# Patient Record
Sex: Male | Born: 1970
Health system: Southern US, Community
[De-identification: ages and names within clinical notes are randomized; demographics above are authoritative.]

---

## 2016-10-31 ENCOUNTER — Emergency Department (HOSPITAL_COMMUNITY): Payer: No Typology Code available for payment source

## 2016-10-31 ENCOUNTER — Encounter (HOSPITAL_COMMUNITY): Payer: Self-pay | Admitting: Emergency Medicine

## 2016-10-31 ENCOUNTER — Emergency Department (HOSPITAL_COMMUNITY)
Admission: EM | Admit: 2016-10-31 | Discharge: 2016-11-01 | Disposition: A | Discharge status: Home / Self Care | Payer: No Typology Code available for payment source | Attending: Emergency Medicine | Admitting: Emergency Medicine

## 2016-10-31 DIAGNOSIS — Y939 Activity, unspecified: Secondary | ICD-10-CM | POA: Insufficient documentation

## 2016-10-31 DIAGNOSIS — S2242XA Multiple fractures of ribs, left side, initial encounter for closed fracture: Secondary | ICD-10-CM

## 2016-10-31 DIAGNOSIS — Y92410 Unspecified street and highway as the place of occurrence of the external cause: Secondary | ICD-10-CM | POA: Diagnosis not present

## 2016-10-31 DIAGNOSIS — F10920 Alcohol use, unspecified with intoxication, uncomplicated: Secondary | ICD-10-CM | POA: Diagnosis not present

## 2016-10-31 DIAGNOSIS — Y999 Unspecified external cause status: Secondary | ICD-10-CM | POA: Diagnosis not present

## 2016-10-31 DIAGNOSIS — R109 Unspecified abdominal pain: Secondary | ICD-10-CM | POA: Diagnosis not present

## 2016-10-31 LAB — I-STAT CG4 LACTIC ACID, ED: Lactic Acid, Venous: 2.03 mmol/L (ref 0.5–1.9)

## 2016-10-31 LAB — I-STAT CHEM 8, ED
BUN: 14 mg/dL (ref 6–20)
CALCIUM ION: 1.04 mmol/L — AB (ref 1.15–1.40)
CREATININE: 1.3 mg/dL — AB (ref 0.61–1.24)
Chloride: 106 mmol/L (ref 101–111)
Glucose, Bld: 102 mg/dL — ABNORMAL HIGH (ref 65–99)
HCT: 43 % (ref 39.0–52.0)
Hemoglobin: 14.6 g/dL (ref 13.0–17.0)
Potassium: 3.3 mmol/L — ABNORMAL LOW (ref 3.5–5.1)
Sodium: 142 mmol/L (ref 135–145)
TCO2: 24 mmol/L (ref 22–32)

## 2016-10-31 LAB — URINALYSIS, ROUTINE W REFLEX MICROSCOPIC
Bilirubin Urine: NEGATIVE
GLUCOSE, UA: NEGATIVE mg/dL
HGB URINE DIPSTICK: NEGATIVE
Ketones, ur: NEGATIVE mg/dL
LEUKOCYTES UA: NEGATIVE
Nitrite: NEGATIVE
PH: 6 (ref 5.0–8.0)
PROTEIN: NEGATIVE mg/dL
Specific Gravity, Urine: 1.018 (ref 1.005–1.030)

## 2016-10-31 LAB — COMPREHENSIVE METABOLIC PANEL
ALT: 26 U/L (ref 17–63)
ANION GAP: 10 (ref 5–15)
AST: 26 U/L (ref 15–41)
Albumin: 4 g/dL (ref 3.5–5.0)
Alkaline Phosphatase: 62 U/L (ref 38–126)
BILIRUBIN TOTAL: 0.8 mg/dL (ref 0.3–1.2)
BUN: 11 mg/dL (ref 6–20)
CHLORIDE: 105 mmol/L (ref 101–111)
CO2: 21 mmol/L — AB (ref 22–32)
CREATININE: 1.07 mg/dL (ref 0.61–1.24)
Calcium: 8.6 mg/dL — ABNORMAL LOW (ref 8.9–10.3)
GFR, EST AFRICAN AMERICAN: 47 mL/min — AB (ref >60)
GFR, EST NON AFRICAN AMERICAN: 41 mL/min — AB (ref >60)
GLUCOSE: 102 mg/dL — AB (ref 65–99)
Potassium: 3.1 mmol/L — ABNORMAL LOW (ref 3.5–5.1)
Sodium: 136 mmol/L (ref 135–145)
Total Protein: 7.2 g/dL (ref 6.5–8.1)

## 2016-10-31 LAB — PROTIME-INR
INR: 0.9
PROTHROMBIN TIME: 12.1 s (ref 11.4–15.2)

## 2016-10-31 LAB — CBC
HCT: 41.1 % (ref 39.0–52.0)
HEMOGLOBIN: 14.3 g/dL (ref 13.0–17.0)
MCH: 30.7 pg (ref 26.0–34.0)
MCHC: 34.8 g/dL (ref 30.0–36.0)
MCV: 88.2 fL (ref 78.0–100.0)
PLATELETS: 256 10*3/uL (ref 150–400)
RBC: 4.66 MIL/uL (ref 4.22–5.81)
RDW: 13.5 % (ref 11.5–15.5)
WBC: 9.5 10*3/uL (ref 4.0–10.5)

## 2016-10-31 LAB — ETHANOL: ALCOHOL ETHYL (B): 240 mg/dL — AB (ref <5)

## 2016-10-31 MED ORDER — IOPAMIDOL (ISOVUE-300) INJECTION 61%
INTRAVENOUS | Status: AC
Start: 2016-10-31 — End: 2016-10-31
  Administered 2016-10-31: 100 mL
  Filled 2016-10-31: qty 100

## 2016-10-31 MED ORDER — SODIUM CHLORIDE 0.9 % IV BOLUS (SEPSIS)
1000.0000 mL | Freq: Once | INTRAVENOUS | Status: AC
  Administered 2016-10-31: 1000 mL via INTRAVENOUS
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
## 2016-10-31 NOTE — ED Notes (Signed)
Pt to xray with RN, EMT, ED MD, and full monitor
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
## 2016-10-31 NOTE — ED Notes (Signed)
Pt to CT with RN, EMT, ED MD and full monitor
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
## 2016-11-01 MED ORDER — IBUPROFEN 600 MG PO TABS: 600.0000 mg | ORAL_TABLET | Freq: Three times a day (TID) | ORAL | 0 refills | Status: DC | PRN

## 2016-11-01 MED ORDER — HYDROCODONE-ACETAMINOPHEN 5-325 MG PO TABS: 1.0000 | ORAL_TABLET | Freq: Four times a day (QID) | ORAL | 0 refills | Status: DC | PRN

## 2016-11-01 NOTE — ED Notes (Signed)
Pt did well ambulating in hall.
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
## 2016-11-01 NOTE — ED Notes (Signed)
Pt on phone with wife 
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
## 2019-04-15 DIAGNOSIS — A419 Sepsis, unspecified organism: Secondary | ICD-10-CM

## 2019-04-15 DIAGNOSIS — I10 Essential (primary) hypertension: Secondary | ICD-10-CM

## 2019-04-15 DIAGNOSIS — B342 Coronavirus infection, unspecified: Secondary | ICD-10-CM

## 2019-04-15 DIAGNOSIS — J189 Pneumonia, unspecified organism: Secondary | ICD-10-CM

## 2019-04-15 DIAGNOSIS — R7303 Prediabetes: Secondary | ICD-10-CM

## 2019-04-15 HISTORY — DX: Sepsis, unspecified organism: A41.9

## 2019-04-15 HISTORY — DX: Essential (primary) hypertension: I10

## 2019-04-15 HISTORY — DX: Prediabetes: R73.03

## 2019-04-15 HISTORY — DX: Hypocalcemia: E83.51

## 2019-04-15 HISTORY — DX: Pneumonia, unspecified organism: J18.9

## 2019-04-15 HISTORY — DX: Coronavirus infection, unspecified: B34.2

## 2019-05-05 ENCOUNTER — Inpatient Hospital Stay (HOSPITAL_COMMUNITY)
Admission: EM | Admit: 2019-05-05 | Discharge: 2019-05-08 | DRG: 177 | Disposition: A | Discharge status: Home / Self Care | Payer: HRSA Program | Attending: Internal Medicine | Admitting: Internal Medicine

## 2019-05-05 ENCOUNTER — Encounter (HOSPITAL_COMMUNITY): Payer: Self-pay | Admitting: Emergency Medicine

## 2019-05-05 ENCOUNTER — Emergency Department (HOSPITAL_COMMUNITY): Payer: HRSA Program

## 2019-05-05 ENCOUNTER — Other Ambulatory Visit: Payer: Self-pay

## 2019-05-05 DIAGNOSIS — J9601 Acute respiratory failure with hypoxia: Secondary | ICD-10-CM | POA: Diagnosis present

## 2019-05-05 DIAGNOSIS — A419 Sepsis, unspecified organism: Secondary | ICD-10-CM | POA: Diagnosis present

## 2019-05-05 DIAGNOSIS — U071 COVID-19: Secondary | ICD-10-CM | POA: Diagnosis present

## 2019-05-05 DIAGNOSIS — E876 Hypokalemia: Secondary | ICD-10-CM | POA: Diagnosis present

## 2019-05-05 DIAGNOSIS — R739 Hyperglycemia, unspecified: Secondary | ICD-10-CM | POA: Diagnosis present

## 2019-05-05 DIAGNOSIS — J189 Pneumonia, unspecified organism: Secondary | ICD-10-CM

## 2019-05-05 DIAGNOSIS — J1282 Pneumonia due to coronavirus disease 2019: Secondary | ICD-10-CM | POA: Diagnosis present

## 2019-05-05 DIAGNOSIS — T380X5A Adverse effect of glucocorticoids and synthetic analogues, initial encounter: Secondary | ICD-10-CM | POA: Diagnosis present

## 2019-05-05 DIAGNOSIS — R0602 Shortness of breath: Secondary | ICD-10-CM | POA: Diagnosis not present

## 2019-05-05 DIAGNOSIS — R7303 Prediabetes: Secondary | ICD-10-CM | POA: Diagnosis present

## 2019-05-05 DIAGNOSIS — A4189 Other specified sepsis: Principal | ICD-10-CM | POA: Diagnosis present

## 2019-05-05 LAB — CBC WITH DIFFERENTIAL/PLATELET
Abs Immature Granulocytes: 0.2 10*3/uL — ABNORMAL HIGH (ref 0.00–0.07)
Abs Immature Granulocytes: 0.25 10*3/uL — ABNORMAL HIGH (ref 0.00–0.07)
Basophils Absolute: 0.1 10*3/uL (ref 0.0–0.1)
Basophils Absolute: 0.1 10*3/uL (ref 0.0–0.1)
Basophils Relative: 1 %
Basophils Relative: 1 %
Eosinophils Absolute: 0.5 10*3/uL (ref 0.0–0.5)
Eosinophils Absolute: 0.5 10*3/uL (ref 0.0–0.5)
Eosinophils Relative: 5 %
Eosinophils Relative: 5 %
HCT: 38.8 % — ABNORMAL LOW (ref 39.0–52.0)
HCT: 36.6 % — ABNORMAL LOW (ref 39.0–52.0)
Hemoglobin: 13.4 g/dL (ref 13.0–17.0)
Hemoglobin: 13.1 g/dL (ref 13.0–17.0)
Immature Granulocytes: 2 %
Immature Granulocytes: 2 %
Lymphocytes Relative: 12 %
Lymphocytes Relative: 13 %
Lymphs Abs: 1.3 10*3/uL (ref 0.7–4.0)
Lymphs Abs: 1.5 10*3/uL (ref 0.7–4.0)
MCH: 30.2 pg (ref 26.0–34.0)
MCH: 32 pg (ref 26.0–34.0)
MCHC: 34.5 g/dL (ref 30.0–36.0)
MCHC: 35.8 g/dL (ref 30.0–36.0)
MCV: 87.6 fL (ref 80.0–100.0)
MCV: 89.3 fL (ref 80.0–100.0)
Monocytes Absolute: 0.7 10*3/uL (ref 0.1–1.0)
Monocytes Absolute: 0.7 10*3/uL (ref 0.1–1.0)
Monocytes Relative: 7 %
Monocytes Relative: 6 %
Neutro Abs: 8 10*3/uL — ABNORMAL HIGH (ref 1.7–7.7)
Neutro Abs: 8.2 10*3/uL — ABNORMAL HIGH (ref 1.7–7.7)
Neutrophils Relative %: 73 %
Neutrophils Relative %: 73 %
Platelets: 487 10*3/uL — ABNORMAL HIGH (ref 150–400)
Platelets: 491 10*3/uL — ABNORMAL HIGH (ref 150–400)
RBC: 4.43 MIL/uL (ref 4.22–5.81)
RBC: 4.1 MIL/uL — ABNORMAL LOW (ref 4.22–5.81)
RDW: 13.1 % (ref 11.5–15.5)
RDW: 13.2 % (ref 11.5–15.5)
WBC: 10.8 10*3/uL — ABNORMAL HIGH (ref 4.0–10.5)
WBC: 11.1 10*3/uL — ABNORMAL HIGH (ref 4.0–10.5)
nRBC: 0 % (ref 0.0–0.2)
nRBC: 0 % (ref 0.0–0.2)

## 2019-05-05 LAB — COMPREHENSIVE METABOLIC PANEL
ALT: 24 U/L (ref 0–44)
ALT: 23 U/L (ref 0–44)
AST: 21 U/L (ref 15–41)
AST: 23 U/L (ref 15–41)
Albumin: 2.6 g/dL — ABNORMAL LOW (ref 3.5–5.0)
Albumin: 2.7 g/dL — ABNORMAL LOW (ref 3.5–5.0)
Alkaline Phosphatase: 74 U/L (ref 38–126)
Alkaline Phosphatase: 74 U/L (ref 38–126)
Anion gap: 15 (ref 5–15)
Anion gap: 14 (ref 5–15)
BUN: 13 mg/dL (ref 6–20)
BUN: 13 mg/dL (ref 6–20)
CO2: 18 mmol/L — ABNORMAL LOW (ref 22–32)
CO2: 20 mmol/L — ABNORMAL LOW (ref 22–32)
Calcium: 8.3 mg/dL — ABNORMAL LOW (ref 8.9–10.3)
Calcium: 8.3 mg/dL — ABNORMAL LOW (ref 8.9–10.3)
Chloride: 100 mmol/L (ref 98–111)
Chloride: 99 mmol/L (ref 98–111)
Creatinine, Ser: 0.99 mg/dL (ref 0.61–1.24)
Creatinine, Ser: 0.96 mg/dL (ref 0.61–1.24)
GFR calc Af Amer: >60 mL/min (ref >60)
GFR calc Af Amer: >60 mL/min (ref >60)
GFR calc non Af Amer: >60 mL/min (ref >60)
GFR calc non Af Amer: >60 mL/min (ref >60)
Glucose, Bld: 135 mg/dL — ABNORMAL HIGH (ref 70–99)
Glucose, Bld: 106 mg/dL — ABNORMAL HIGH (ref 70–99)
Potassium: 3.4 mmol/L — ABNORMAL LOW (ref 3.5–5.1)
Potassium: 3.5 mmol/L (ref 3.5–5.1)
Sodium: 133 mmol/L — ABNORMAL LOW (ref 135–145)
Sodium: 133 mmol/L — ABNORMAL LOW (ref 135–145)
Total Bilirubin: 1 mg/dL (ref 0.3–1.2)
Total Bilirubin: 1.2 mg/dL (ref 0.3–1.2)
Total Protein: 6.7 g/dL (ref 6.5–8.1)
Total Protein: 6.7 g/dL (ref 6.5–8.1)

## 2019-05-05 LAB — URINALYSIS, ROUTINE W REFLEX MICROSCOPIC
Bilirubin Urine: NEGATIVE
Glucose, UA: NEGATIVE mg/dL
Hgb urine dipstick: NEGATIVE
Ketones, ur: 20 mg/dL — AB
Leukocytes,Ua: NEGATIVE
Nitrite: NEGATIVE
Protein, ur: NEGATIVE mg/dL
Specific Gravity, Urine: 1.013 (ref 1.005–1.030)
pH: 6 (ref 5.0–8.0)

## 2019-05-05 LAB — CK: Total CK: 170 U/L (ref 49–397)

## 2019-05-05 LAB — LACTIC ACID, PLASMA: Lactic Acid, Venous: 1.2 mmol/L (ref 0.5–1.9)

## 2019-05-05 LAB — PROTIME-INR
INR: 1.1 (ref 0.8–1.2)
Prothrombin Time: 14.1 seconds (ref 11.4–15.2)

## 2019-05-05 LAB — TROPONIN I (HIGH SENSITIVITY): Troponin I (High Sensitivity): 4 ng/L (ref <18)

## 2019-05-05 MED ORDER — ACETAMINOPHEN 325 MG PO TABS
650.0000 mg | ORAL_TABLET | Freq: Once | ORAL | Status: AC
  Administered 2019-05-05: 650 mg via ORAL
  Filled 2019-05-05: qty 2

## 2019-05-05 MED ORDER — SODIUM CHLORIDE 0.9 % IV SOLN
500.0000 mg | Freq: Once | INTRAVENOUS | Status: AC
  Administered 2019-05-05: 500 mg via INTRAVENOUS
  Filled 2019-05-05: qty 500

## 2019-05-05 MED ORDER — SODIUM CHLORIDE 0.9 % IV SOLN
1.0000 g | Freq: Once | INTRAVENOUS | Status: AC
  Administered 2019-05-05: 1 g via INTRAVENOUS
  Filled 2019-05-05: qty 10

## 2019-05-05 NOTE — ED Triage Notes (Signed)
Patient reports persistent productive cough with chest congestion , fever , chills and mild dizziness onset last week .

## 2019-05-05 NOTE — H&P (Signed)
[employer] Kees RFX:[id number] DOB: 04/12/1970 DOA: 05/05/2019     PCP: Patient, No Pcp Per   Outpatient Specialists:   NONE   Patient arrived to ER on 05/05/19 at 1902  Patient coming from: home With family   Chief Complaint:   Chief Complaint  Patient presents with  . Cough/Fever    HPI: Fernando Lozano is a 49 y.o. male with medical history significant of history of alcohol abuse    Presented with 2 week history of persistent cough fevers chills myalgias dizziness   Reports chest pain with coughing.   Infectious risk factors:  Reports fever, shortness of breath, dry cough, chest pain, Sore throat, URI symptoms, anosmia/change in taste,    Has  NOt been vaccinated against COVID    in house  PCR testing  Pending  No results found for: SARSCOV2NAA   Regarding pertinent Chronic problems:    While in ER: Chest x-ray showed mild diffuse bilateral infiltrates Hypoxic down to 88% when ambulating   Hospitalist was called for admission for acute respiratory failure   The following Work up has been ordered so far:  Orders Placed This Encounter  Procedures  . Culture, blood (single)  . SARS CORONAVIRUS 2 (TAT 6-24 HRS) Nasopharyngeal Nasopharyngeal Swab  . Urine culture  . DG Chest Portable 1 View  . CBC with Differential  . Comprehensive metabolic panel  . Lactic acid, plasma  . Comprehensive metabolic panel  . CBC with Differential  . Protime-INR  . CK  . Urinalysis, Routine w reflex microscopic  . Consult for [place] Admission  ALL PATIENTS BEING ADMITTED/HAVING PROCEDURES NEED COVID-19 SCREENING  . Airborne and Contact precautions  . POC SARS Coronavirus 2 Ag-ED - Nasal Swab (BD Veritor Kit)  . EKG 12-Lead    Following Medications were ordered in ER: Medications  azithromycin (ZITHROMAX) 500 mg in sodium chloride 0.9 % 250 mL IVPB (500 mg Intravenous New Bag/Given 05/05/19 2248)  acetaminophen (TYLENOL) tablet 650 mg (650 mg  Oral Given 05/05/19 1924)  cefTRIAXone (ROCEPHIN) 1 g in sodium chloride 0.9 % 100 mL IVPB (0 g Intravenous Stopped 05/05/19 2319)      Significant initial  Findings: Abnormal Labs Reviewed  CBC WITH DIFFERENTIAL/PLATELET - Abnormal; Notable for the following components:      Result Value   WBC 10.8 (*)    HCT 38.8 (*)    Platelets 487 (*)    Neutro Abs 8.0 (*)    Abs Immature Granulocytes 0.20 (*)    All other components within normal limits  COMPREHENSIVE METABOLIC PANEL - Abnormal; Notable for the following components:   Sodium 133 (*)    Potassium 3.4 (*)    CO2 18 (*)    Glucose, Bld 135 (*)    Calcium 8.3 (*)    Albumin 2.6 (*)    All other components within normal limits  COMPREHENSIVE METABOLIC PANEL - Abnormal; Notable for the following components:   Sodium 133 (*)    CO2 20 (*)    Glucose, Bld 106 (*)    Calcium 8.3 (*)    Albumin 2.7 (*)    All other components within normal limits  CBC WITH DIFFERENTIAL/PLATELET - Abnormal; Notable for the following components:   WBC 11.1 (*)    RBC 4.10 (*)    HCT 36.6 (*)    Platelets 491 (*)    Neutro Abs 8.2 (*)    Abs Immature Granulocytes 0.25 (*)    All other  components within normal limits  URINALYSIS, ROUTINE W REFLEX MICROSCOPIC - Abnormal; Notable for the following components:   Ketones, ur 20 (*)    All other components within normal limits    Otherwise labs showing:    Recent Labs  Lab 05/05/19 1927 05/05/19 2156  NA 133* 133*  K 3.4* 3.5  CO2 18* 20*  GLUCOSE 135* 106*  BUN 13 13  CREATININE 0.99 0.96  CALCIUM 8.3* 8.3*    Cr  stable,    Lab Results  Component Value Date   CREATININE 0.96 05/05/2019   CREATININE 0.99 05/05/2019   CREATININE 1.30 (H) 10/31/2016    Recent Labs  Lab 05/05/19 1927 05/05/19 2156  AST 21 23  ALT 24 23  ALKPHOS 74 74  BILITOT 1.0 1.2  PROT 6.7 6.7  ALBUMIN 2.6* 2.7*   Lab Results  Component Value Date   CALCIUM 8.3 (L) 05/05/2019       WBC         Component Value Date/Time   WBC 11.1 (H) 05/05/2019 2156   ANC    Component Value Date/Time   NEUTROABS 8.2 (H) 05/05/2019 2156   ALC No components found for: LYMPHAB    Plt: Lab Results  Component Value Date   PLT 491 (H) 05/05/2019     Lactic Acid, Venous    Component Value Date/Time   LATICACIDVEN 1.2 05/05/2019 2156    Procalcitonin  Ordered   COVID-19 Labs   No results found for: SARSCOV2NAA      HG/HCT stable,       Component Value Date/Time   HGB 13.1 05/05/2019 2156   HCT 36.6 (L) 05/05/2019 2156      Troponin 4 Cardiac Panel (last 3 results) Recent Labs    05/05/19 2224  CKTOTAL 170       ECG: Ordered Personally reviewed by me showing: HR : 97 Rhythm:  Sinus     no evidence of ischemic changes QTC 447    UA  no evidence of UTI      Urine analysis:    Component Value Date/Time   COLORURINE YELLOW 05/05/2019 2253   APPEARANCEUR CLEAR 05/05/2019 2253   LABSPEC 1.013 05/05/2019 2253   PHURINE 6.0 05/05/2019 2253   GLUCOSEU NEGATIVE 05/05/2019 2253   HGBUR NEGATIVE 05/05/2019 2253   BILIRUBINUR NEGATIVE 05/05/2019 2253   KETONESUR 20 (A) 05/05/2019 2253   PROTEINUR NEGATIVE 05/05/2019 2253   NITRITE NEGATIVE 05/05/2019 2253   LEUKOCYTESUR NEGATIVE 05/05/2019 2253         CXR - bilateral infiltrates    CTA chest -  Ordered       ED Triage Vitals  Enc Vitals Group     BP 05/05/19 1912 114/77     Pulse Rate 05/05/19 1912 90     Resp 05/05/19 1912 (!) 22     Temp 05/05/19 1912 (!) 101.9 F (38.8 C)     Temp Source 05/05/19 1912 Oral     SpO2 05/05/19 1912 98 %     Weight 05/05/19 1912 160 lb (72.6 kg)     Height 05/05/19 1912 '5\' 2"'$  (1.575 m)     Head Circumference --      Peak Flow --      Pain Score 05/05/19 1918 0     Pain Loc --      Pain Edu? --      Excl. in [facility]? --   TMAX(24)@       Latest  Blood  pressure 114/74, pulse 71, temperature (!) 100.5 F (38.1 C), temperature source Oral, resp. rate 20, height '5\' 2"'$   (1.575 m), weight 72.6 kg, SpO2 94 %.       Review of Systems:    Pertinent positives include:  Fevers, chills, fatigue, chest pain  non-productive cough, dyspnea on exertion shortness of breath at rest.  Constitutional:  No weight loss, night sweats,  weight loss  HEENT:  No headaches, Difficulty swallowing,Tooth/dental problems,Sore throat,  No sneezing, itching, ear ache, nasal congestion, post nasal drip,  Cardio-vascular:  No , Orthopnea, PND, anasarca, dizziness, palpitations.no Bilateral lower extremity swelling  GI:  No heartburn, indigestion, abdominal pain, nausea, vomiting, diarrhea, change in bowel habits, loss of appetite, melena, blood in stool, hematemesis Resp:   No excess mucus, no productive cough,  No coughing up of blood.No change in color of mucus.No wheezing. Skin:  no rash or lesions. No jaundice GU:  no dysuria, change in color of urine, no urgency or frequency. No straining to urinate.  No flank pain.  Musculoskeletal:  No joint pain or no joint swelling. No decreased range of motion. No back pain.  Psych:  No change in mood or affect. No depression or anxiety. No memory loss.  Neuro: no localizing neurological complaints, no tingling, no weakness, no double vision, no gait abnormality, no slurred speech, no confusion  All systems reviewed and apart from [place] all are negative  Past Medical History:  History reviewed. No pertinent past medical history.    History reviewed. No pertinent surgical history.  Social History:  Ambulatory  independently       reports that he has never smoked. He has never used smokeless tobacco. He reports current alcohol use. He reports that he does not use drugs.   Family History:   Family History  Problem Relation Age of Onset  . Hypertension Neg Hx   . Diabetes Neg Hx     Allergies: No Known Allergies   Prior to Admission medications   Medication Sig Start Date End Date Taking? Authorizing Provider    HYDROcodone-acetaminophen (NORCO/VICODIN) 5-325 MG tablet Take 1 tablet by mouth every 6 (six) hours as needed for severe pain. 11/01/16   Ripley Fraise, MD  ibuprofen (ADVIL,MOTRIN) 600 MG tablet Take 1 tablet (600 mg total) by mouth every 8 (eight) hours as needed for moderate pain. 11/01/16   Ripley Fraise, MD   Physical Exam: Blood pressure 114/74, pulse 71, temperature (!) 100.5 F (38.1 C), temperature source Oral, resp. rate 20, height '5\' 2"'$  (1.575 m), weight 72.6 kg, SpO2 94 %. 1. General:  in No  Acute distress   Chronically ill  -appearing 2. Psychological: Alert and   Oriented 3. Head/ENT:    Dry Mucous Membranes                          Head Non traumatic, neck supple                       Poor Dentition 4. SKIN:  decreased Skin turgor,  Skin clean Dry and intact no rash 5. Heart: Regular rate and rhythm no  Murmur, no Rub or gallop 6. Lungs:   no wheezes or crackles   7. Abdomen: Soft non-tender, Non distended   bowel sounds present 8. Lower extremities: no clubbing, cyanosis, no edema 9. Neurologically Grossly intact, moving all 4 extremities equally   10. MSK: Normal range of motion  All other LABS:     Recent Labs  Lab 05/05/19 1927 05/05/19 2156  WBC 10.8* 11.1*  NEUTROABS 8.0* 8.2*  HGB 13.4 13.1  HCT 38.8* 36.6*  MCV 87.6 89.3  PLT 487* 491*     Recent Labs  Lab 05/05/19 1927 05/05/19 2156  NA 133* 133*  K 3.4* 3.5  CL 100 99  CO2 18* 20*  GLUCOSE 135* 106*  BUN 13 13  CREATININE 0.99 0.96  CALCIUM 8.3* 8.3*     Recent Labs  Lab 05/05/19 1927 05/05/19 2156  AST 21 23  ALT 24 23  ALKPHOS 74 74  BILITOT 1.0 1.2  PROT 6.7 6.7  ALBUMIN 2.6* 2.7*       Cultures: No results found for: SDES, SPECREQUEST, CULT, REPTSTATUS   Radiological Exams on Admission: DG Chest Portable 1 View  Result Date: 05/05/2019 CLINICAL DATA:  Fever, cough and congestion. EXAM: PORTABLE CHEST 1 VIEW COMPARISON:  October 31, 2016 FINDINGS: Mild diffuse  bilateral infiltrates are seen involving predominantly the lateral aspects of both lungs. There is no evidence of a pleural effusion or pneumothorax. The heart size and mediastinal contours are within normal limits. The visualized skeletal structures are unremarkable. IMPRESSION: Mild diffuse bilateral infiltrates. Electronically Signed   By: Virgina Norfolk M.D.   On: 05/05/2019 21:14    Chart has been reviewed    Assessment/Plan  49 y.o. male with medical history significant of history of alcohol abuse     Admitted for CAP strong suspicion for Covid  Present on Admission: . Acute respiratory failure with hypoxia (HCC) -secondary to pneumonia of note patient prior had CT scans in 2018 that showed bilateral interstitial opacities cannot rule out interstitial lung disease will obtain CT to further evaluate   . CAP (community acquired pneumonia)-for now cover for bacterial pneumonia initial rapid Covid test negative but will await results of PCR very high pretest probability for Covid given typical symptoms.  We will continue as a PUI.   . Sepsis ([place]) -persistent febrile illness will treat with IV antibiotics and continue to monitor most likely cause of sepsis being community-acquired pneumonia versus Covid.  Prior history of alcohol abuse currently in remission  Other plan as per orders.  DVT prophylaxis:  Lovenox     Code Status:  FULL CODE  as per patient   I had personally discussed CODE STATUS with patient     Family Communication:   Family not at  Bedside    Disposition Plan:        To home once workup is complete and patient is stable   Following barriers for discharge:                                                 Will likely need home health, home O2, set up if still hypoxic                                                            Consults called: none    Admission status:  ED Disposition    ED Disposition Condition [facility] Area: [place] [  [id number]]  Level of Care: Telemetry Medical [104]  I expect the patient will be discharged within 24 hours: No (not a candidate for 5C-Observation unit)  Covid Evaluation: Symptomatic Person Under Investigation (PUI)  Diagnosis: CAP (community acquired pneumonia) [160109]  Admitting Physician: Toy Baker [[id number]]  Attending Physician: Toy Baker [[id number]]       Obs      Level of care    tele  For 12H     Precautions: admitted as PUI  Airborne and Contact precautions  If Covid PCR is negative  -  would need additional investigation given very high risk for false native test result   PPE: Used by the provider:   P100  eye Goggles,  Gloves  gown       Ranvir Renovato 05/05/2019, 12:06 AM    [facility]     after 2 AM please page floor coverage PA If 7AM-7PM, please contact the day team taking care of the patient using [email or web address]   Patient was evaluated in the context of the global COVID-19 pandemic, which necessitated consideration that the patient might be at risk for infection with the SARS-CoV-2 virus that causes COVID-19. Institutional protocols and algorithms that pertain to the evaluation of patients at risk for COVID-19 are in a state of rapid change based on information released by regulatory bodies including the CDC and federal and state organizations. These policies and algorithms were followed during the patient's care.

## 2019-05-05 NOTE — ED Provider Notes (Signed)
[place] EMERGENCY DEPARTMENT Provider Note   CSN: [id number] Arrival date & time: 05/05/19  1902     History Chief Complaint  Patient presents with  . Cough/Fever    [place] is a 49 y.o. male.  HPI Patient is a 49 year old male with no significant past medical history presenting to the ED today due to shortness of breath.  Patient says that for the past week, he has experienced diarrhea, dry cough and fever.  His cough is nonproductive but when he begins to cough he experiences chest tightness.  He says that since Friday, he has experienced decreased appetite and has been vomiting.  He works at a [employer] site where many of his coworkers have had cough and congestion over the past few weeks.  He denies any known Covid contacts.  He denies change in taste or smell.    History reviewed. No pertinent past medical history.  There are no problems to display for this patient.   History reviewed. No pertinent surgical history.     No family history on file.  Social History   Tobacco Use  . Smoking status: Unknown If Ever Smoked  . Smokeless tobacco: Never Used  Substance Use Topics  . Alcohol use: Yes  . Drug use: Never    Home Medications Prior to Admission medications   Medication Sig Start Date End Date Taking? Authorizing Provider  HYDROcodone-acetaminophen (NORCO/VICODIN) 5-325 MG tablet Take 1 tablet by mouth every 6 (six) hours as needed for severe pain. 11/01/16   Ripley Fraise, MD  ibuprofen (ADVIL,MOTRIN) 600 MG tablet Take 1 tablet (600 mg total) by mouth every 8 (eight) hours as needed for moderate pain. 11/01/16   Ripley Fraise, MD    Allergies    Patient has no known allergies.  Review of Systems   Review of Systems  Constitutional: Positive for appetite change, chills, fatigue and fever.  HENT: Positive for sore throat. Negative for rhinorrhea.   Eyes: Negative for photophobia and visual disturbance.    Respiratory: Positive for cough, chest tightness and shortness of breath.   Cardiovascular: Negative for palpitations.  Gastrointestinal: Positive for diarrhea, nausea and vomiting. Negative for abdominal pain.  Genitourinary: Negative for dysuria and hematuria.  Musculoskeletal: Negative for arthralgias and back pain.  Skin: Negative for color change and rash.  Neurological: Negative for weakness and light-headedness.  Psychiatric/Behavioral: Negative for agitation.  All other systems reviewed and are negative.   Physical Exam Updated Vital Signs BP 114/86   Pulse 75   Temp (!) 100.5 F (38.1 C) (Oral)   Resp (!) 22   Ht 5\' 2"  (1.575 m)   Wt 72.6 kg   SpO2 96%   BMI 29.26 kg/m   Physical Exam Vitals and nursing note reviewed.  Constitutional:      General: He is not in acute distress.    Appearance: Normal appearance. He is well-developed. He is not ill-appearing.  HENT:     Head: Normocephalic and atraumatic.     Right Ear: External ear normal.     Left Ear: External ear normal.     Nose: Nose normal. No congestion or rhinorrhea.     Mouth/Throat:     Mouth: Mucous membranes are moist.     Pharynx: Oropharynx is clear.  Eyes:     Extraocular Movements: Extraocular movements intact.     Pupils: Pupils are equal, round, and reactive to light.  Cardiovascular:     Rate and Rhythm: Normal rate  and regular rhythm.     Pulses: Normal pulses.     Heart sounds: Normal heart sounds.  Pulmonary:     Effort: Pulmonary effort is normal. No respiratory distress.     Breath sounds: Normal breath sounds. No stridor. No wheezing, rhonchi or rales.     Comments: Mildly increased work of breathing. Abdominal:     General: There is no distension.     Palpations: Abdomen is soft.     Tenderness: There is no abdominal tenderness. There is no guarding or rebound.  Musculoskeletal:        General: Normal range of motion.     Cervical back: Normal range of motion and neck supple. No  rigidity.     Right lower leg: No edema.     Left lower leg: No edema.  Skin:    General: Skin is warm and dry.     Capillary Refill: Capillary refill takes less than 2 seconds.  Neurological:     General: No focal deficit present.     Mental Status: He is alert and oriented to person, place, and time. Mental status is at baseline.  Psychiatric:        Mood and Affect: Mood normal.     ED Results / Procedures / Treatments   Labs (all labs ordered are listed, but only abnormal results are displayed) Labs Reviewed  CBC WITH DIFFERENTIAL/PLATELET - Abnormal; Notable for the following components:      Result Value   WBC 10.8 (*)    HCT 38.8 (*)    Platelets 487 (*)    Neutro Abs 8.0 (*)    Abs Immature Granulocytes 0.20 (*)    All other components within normal limits  COMPREHENSIVE METABOLIC PANEL - Abnormal; Notable for the following components:   Sodium 133 (*)    Potassium 3.4 (*)    CO2 18 (*)    Glucose, Bld 135 (*)    Calcium 8.3 (*)    Albumin 2.6 (*)    All other components within normal limits  COMPREHENSIVE METABOLIC PANEL - Abnormal; Notable for the following components:   Sodium 133 (*)    CO2 20 (*)    Glucose, Bld 106 (*)    Calcium 8.3 (*)    Albumin 2.7 (*)    All other components within normal limits  CBC WITH DIFFERENTIAL/PLATELET - Abnormal; Notable for the following components:   WBC 11.1 (*)    RBC 4.10 (*)    HCT 36.6 (*)    Platelets 491 (*)    Neutro Abs 8.2 (*)    Abs Immature Granulocytes 0.25 (*)    All other components within normal limits  URINALYSIS, ROUTINE W REFLEX MICROSCOPIC - Abnormal; Notable for the following components:   Ketones, ur 20 (*)    All other components within normal limits  D-DIMER, QUANTITATIVE (NOT AT [facility]) - Abnormal; Notable for the following components:   D-Dimer, Quant 2.02 (*)    All other components within normal limits  FIBRINOGEN - Abnormal; Notable for the following components:   Fibrinogen >800 (*)     All other components within normal limits  LACTATE DEHYDROGENASE - Abnormal; Notable for the following components:   LDH 323 (*)    All other components within normal limits  CULTURE, BLOOD (SINGLE)  SARS CORONAVIRUS 2 (TAT 6-24 HRS)  URINE CULTURE  EXPECTORATED SPUTUM ASSESSMENT W REFEX TO RESP CULTURE  RESPIRATORY PANEL BY PCR  LACTIC ACID, PLASMA  PROTIME-INR  CK  HIV ANTIBODY (ROUTINE TESTING W REFLEX)  C-REACTIVE PROTEIN  FERRITIN  PROCALCITONIN  RAPID URINE DRUG SCREEN, HOSP PERFORMED  ETHANOL  MAGNESIUM  PHOSPHORUS  COMPREHENSIVE METABOLIC PANEL  STREP PNEUMONIAE URINARY ANTIGEN  PROCALCITONIN  CBC  TSH  POC SARS CORONAVIRUS 2 AG -  ED  TROPONIN I (HIGH SENSITIVITY)  TROPONIN I (HIGH SENSITIVITY)    EKG EKG Interpretation  Date/Time:  Sunday May 05 2019 19:11:10 EDT Ventricular Rate:  97 PR Interval:  158 QRS Duration: 94 QT Interval:  352 QTC Calculation: 447 R Axis:   53 Text Interpretation: Normal sinus rhythm T wave abnormality Artifact Baseline wander Abnormal ECG Confirmed by Carmin Muskrat [id number]) on 05/05/2019 10:23:48 PM   Radiology DG Chest Portable 1 View  Result Date: 05/05/2019 CLINICAL DATA:  Fever, cough and congestion. EXAM: PORTABLE CHEST 1 VIEW COMPARISON:  October 31, 2016 FINDINGS: Mild diffuse bilateral infiltrates are seen involving predominantly the lateral aspects of both lungs. There is no evidence of a pleural effusion or pneumothorax. The heart size and mediastinal contours are within normal limits. The visualized skeletal structures are unremarkable. IMPRESSION: Mild diffuse bilateral infiltrates. Electronically Signed   By: Virgina Norfolk M.D.   On: 05/05/2019 21:14    Procedures Procedures (including critical care time)  Medications Ordered in ED Medications  acetaminophen (TYLENOL) tablet 650 mg (has no administration in time range)    Or  acetaminophen (TYLENOL) suppository 650 mg (has no administration in time  range)  HYDROcodone-acetaminophen (NORCO/VICODIN) 5-325 MG per tablet 1-2 tablet (has no administration in time range)  ondansetron (ZOFRAN) tablet 4 mg (has no administration in time range)    Or  ondansetron (ZOFRAN) injection 4 mg (has no administration in time range)  cefTRIAXone (ROCEPHIN) 2 g in sodium chloride 0.9 % 100 mL IVPB (has no administration in time range)  azithromycin (ZITHROMAX) 500 mg in sodium chloride 0.9 % 250 mL IVPB (has no administration in time range)  enoxaparin (LOVENOX) injection 40 mg (40 mg Subcutaneous Given 05/06/19 0022)  sodium chloride flush (NS) 0.9 % injection 3 mL (3 mLs Intravenous Given 05/06/19 0007)  sodium chloride flush (NS) 0.9 % injection 3 mL (has no administration in time range)  0.9 %  sodium chloride infusion (has no administration in time range)  acetaminophen (TYLENOL) tablet 650 mg (650 mg Oral Given 05/05/19 1924)  cefTRIAXone (ROCEPHIN) 1 g in sodium chloride 0.9 % 100 mL IVPB (0 g Intravenous Stopped 05/05/19 2319)  azithromycin (ZITHROMAX) 500 mg in sodium chloride 0.9 % 250 mL IVPB (0 mg Intravenous Stopped 05/05/19 2342)    ED Course  I have reviewed the triage vital signs and the nursing notes.  Pertinent labs & imaging results that were available during my care of the patient were reviewed by me and considered in my medical decision making (see chart for details).    MDM Rules/Calculators/A&P                     Patient is a 49 year old male with no significant past medical history presenting to the ED today due to shortness of breath.  On exam, patient has mildly increased work of breathing.  BP 114/77, HR 90, RR 22, SPO2 98% on room air.  Temp 101.1F.  On arrival, patient appears to have increased work of breathing but has no signs of hypoxia.  He coughs frequently during my assessment and has to stop and catch his breath.  He says that he has had persistent fever  and cough for the past week with other associated viral type  symptoms.  He has had many sick contacts at work but no one has tested positive for Covid as far as he knows.  EKG shows normal sinus rhythm with T wave inversions in II, III and aVF that are not seen on previous studies.  Chest x-ray shows "mild diffuse bilateral infiltrates."  Initial Covid antigen test negative.  Collected 6-hour Covid test.  Given patient's fever and tachypnea, he meets SIRS criteria.  Blood cultures collected.  Initiated antibiotics for community-acquired pneumonia.  However, we have a strong suspicion that he does have Covid pneumonia.  We will hold on IV fluids initially to our high concern for Covid.   CBC with WBC 11.1, absolute neutrophils 8.2.  CMP with CO2 20, calcium 8.3, albumin 2.7.  Lactic acid 1.2.  Initial troponin is 4.  Urinalysis with no signs of infection.  Patient ambulated around his room and his SPO2 was noted to drop to 88%.  It was noted to come back up to the mid 90s with rest.  At this time, patient appears to have sepsis with a potential source of community-acquired pneumonia.  Patient may also be experiencing complications of [id number].  After discussion with inpatient team, will obtain CTA to get a better imaging of his lungs and evaluate for PE.  Patient admitted to hospitalist service.  For details on hospital course following admission, please refer to inpatient team's note.  Patient stable at time of admission.  Patient assessed and evaluated with Dr. Vanita Panda.  Nadeen Landau, MD     Final Clinical Impression(s) / ED Diagnoses Final diagnoses:  Sepsis with acute hypoxic respiratory failure, due to unspecified organism, unspecified whether septic shock present [facility])    Rx / DC Orders ED Discharge Orders    None       Nadeen Landau, MD 05/06/19 [id number]    Carmin Muskrat, MD 05/06/19 [id number]    Carmin Muskrat, MD 05/21/19 1630

## 2019-05-05 NOTE — ED Notes (Signed)
Per interpretur   Pt symptoms started around 2 weeks ago. He noticed he started getting a cough a few days ago and when he coughs he has chest pain. The fevers started around a week ago. Pt states he works but everyone wears a mask so he does not know if he has been exposed to COVID-19.

## 2019-05-05 NOTE — ED Notes (Signed)
Pt ambulated in room, SpO2 dropped to 88% on RA, and returned to 98% when resting.

## 2019-05-06 ENCOUNTER — Inpatient Hospital Stay (HOSPITAL_COMMUNITY): Payer: HRSA Program

## 2019-05-06 DIAGNOSIS — R0602 Shortness of breath: Secondary | ICD-10-CM | POA: Diagnosis present

## 2019-05-06 DIAGNOSIS — U071 COVID-19: Secondary | ICD-10-CM | POA: Diagnosis present

## 2019-05-06 DIAGNOSIS — J9601 Acute respiratory failure with hypoxia: Secondary | ICD-10-CM | POA: Diagnosis present

## 2019-05-06 DIAGNOSIS — E876 Hypokalemia: Secondary | ICD-10-CM

## 2019-05-06 DIAGNOSIS — A4189 Other specified sepsis: Secondary | ICD-10-CM | POA: Diagnosis present

## 2019-05-06 DIAGNOSIS — R739 Hyperglycemia, unspecified: Secondary | ICD-10-CM | POA: Diagnosis present

## 2019-05-06 DIAGNOSIS — T380X5A Adverse effect of glucocorticoids and synthetic analogues, initial encounter: Secondary | ICD-10-CM | POA: Diagnosis present

## 2019-05-06 DIAGNOSIS — R7303 Prediabetes: Secondary | ICD-10-CM | POA: Diagnosis present

## 2019-05-06 DIAGNOSIS — J1282 Pneumonia due to coronavirus disease 2019: Secondary | ICD-10-CM | POA: Diagnosis present

## 2019-05-06 LAB — LACTATE DEHYDROGENASE: LDH: 323 U/L — ABNORMAL HIGH (ref 98–192)

## 2019-05-06 LAB — CBC
HCT: 35.2 % — ABNORMAL LOW (ref 39.0–52.0)
Hemoglobin: 12.1 g/dL — ABNORMAL LOW (ref 13.0–17.0)
MCH: 30 pg (ref 26.0–34.0)
MCHC: 34.4 g/dL (ref 30.0–36.0)
MCV: 87.3 fL (ref 80.0–100.0)
Platelets: 500 10*3/uL — ABNORMAL HIGH (ref 150–400)
RBC: 4.03 MIL/uL — ABNORMAL LOW (ref 4.22–5.81)
RDW: 13.2 % (ref 11.5–15.5)
WBC: 10 10*3/uL (ref 4.0–10.5)
nRBC: 0 % (ref 0.0–0.2)

## 2019-05-06 LAB — URINE CULTURE: Culture: NO GROWTH

## 2019-05-06 LAB — COMPREHENSIVE METABOLIC PANEL
ALT: 20 U/L (ref 0–44)
AST: 21 U/L (ref 15–41)
Albumin: 2.4 g/dL — ABNORMAL LOW (ref 3.5–5.0)
Alkaline Phosphatase: 73 U/L (ref 38–126)
Anion gap: 14 (ref 5–15)
BUN: 13 mg/dL (ref 6–20)
CO2: 20 mmol/L — ABNORMAL LOW (ref 22–32)
Calcium: 8 mg/dL — ABNORMAL LOW (ref 8.9–10.3)
Chloride: 100 mmol/L (ref 98–111)
Creatinine, Ser: 0.84 mg/dL (ref 0.61–1.24)
GFR calc Af Amer: >60 mL/min (ref >60)
GFR calc non Af Amer: >60 mL/min (ref >60)
Glucose, Bld: 99 mg/dL (ref 70–99)
Potassium: 3.3 mmol/L — ABNORMAL LOW (ref 3.5–5.1)
Sodium: 134 mmol/L — ABNORMAL LOW (ref 135–145)
Total Bilirubin: 0.4 mg/dL (ref 0.3–1.2)
Total Protein: 6.4 g/dL — ABNORMAL LOW (ref 6.5–8.1)

## 2019-05-06 LAB — PHOSPHORUS: Phosphorus: 4.4 mg/dL (ref 2.5–4.6)

## 2019-05-06 LAB — RAPID URINE DRUG SCREEN, HOSP PERFORMED
Amphetamines: NOT DETECTED
Barbiturates: NOT DETECTED
Benzodiazepines: NOT DETECTED
Cocaine: NOT DETECTED
Opiates: NOT DETECTED
Tetrahydrocannabinol: NOT DETECTED

## 2019-05-06 LAB — TROPONIN I (HIGH SENSITIVITY): Troponin I (High Sensitivity): 6 ng/L (ref <18)

## 2019-05-06 LAB — PROCALCITONIN
Procalcitonin: 0.44 ng/mL
Procalcitonin: 0.4 ng/mL

## 2019-05-06 LAB — C-REACTIVE PROTEIN: CRP: 25.9 mg/dL — ABNORMAL HIGH (ref <1.0)

## 2019-05-06 LAB — STREP PNEUMONIAE URINARY ANTIGEN: Strep Pneumo Urinary Antigen: NEGATIVE

## 2019-05-06 LAB — ETHANOL: Alcohol, Ethyl (B): <10 mg/dL (ref <10)

## 2019-05-06 LAB — HIV ANTIBODY (ROUTINE TESTING W REFLEX): HIV Screen 4th Generation wRfx: NONREACTIVE

## 2019-05-06 LAB — TSH: TSH: 0.917 u[IU]/mL (ref 0.350–4.500)

## 2019-05-06 LAB — FIBRINOGEN: Fibrinogen: >800 mg/dL — ABNORMAL HIGH (ref 210–475)

## 2019-05-06 LAB — SARS CORONAVIRUS 2 (TAT 6-24 HRS): SARS Coronavirus 2: POSITIVE — AB

## 2019-05-06 LAB — D-DIMER, QUANTITATIVE: D-Dimer, Quant: 2.02 ug/mL-FEU — ABNORMAL HIGH (ref 0.00–0.50)

## 2019-05-06 LAB — MAGNESIUM: Magnesium: 2 mg/dL (ref 1.7–2.4)

## 2019-05-06 LAB — FERRITIN: Ferritin: 355 ng/mL — ABNORMAL HIGH (ref 24–336)

## 2019-05-06 MED ORDER — ONDANSETRON HCL 4 MG PO TABS: 4.0000 mg | ORAL_TABLET | Freq: Four times a day (QID) | ORAL | Status: DC | PRN

## 2019-05-06 MED ORDER — ONDANSETRON HCL 4 MG/2ML IJ SOLN: 4.0000 mg | Freq: Four times a day (QID) | INTRAMUSCULAR | Status: DC | PRN

## 2019-05-06 MED ORDER — SODIUM CHLORIDE 0.9% FLUSH
3.0000 mL | Freq: Two times a day (BID) | INTRAVENOUS | Status: DC
  Administered 2019-05-06 – 2019-05-08 (×5): 3 mL via INTRAVENOUS

## 2019-05-06 MED ORDER — HYDROCOD POLST-CPM POLST ER 10-8 MG/5ML PO SUER: 5.0000 mL | Freq: Two times a day (BID) | ORAL | Status: DC | PRN

## 2019-05-06 MED ORDER — SODIUM CHLORIDE 0.9% FLUSH: 3.0000 mL | INTRAVENOUS | Status: DC | PRN

## 2019-05-06 MED ORDER — METHYLPREDNISOLONE SODIUM SUCC 125 MG IJ SOLR
80.0000 mg | Freq: Once | INTRAMUSCULAR | Status: AC
  Administered 2019-05-06: 80 mg via INTRAVENOUS
  Filled 2019-05-06: qty 2

## 2019-05-06 MED ORDER — BENZONATATE 100 MG PO CAPS
200.0000 mg | ORAL_CAPSULE | Freq: Three times a day (TID) | ORAL | Status: DC
  Administered 2019-05-06 – 2019-05-08 (×7): 200 mg via ORAL
  Filled 2019-05-06 (×8): qty 2

## 2019-05-06 MED ORDER — ENOXAPARIN SODIUM 40 MG/0.4ML ~~LOC~~ SOLN
40.0000 mg | SUBCUTANEOUS | Status: DC
  Administered 2019-05-06 – 2019-05-07 (×3): 40 mg via SUBCUTANEOUS
  Filled 2019-05-06 (×3): qty 0.4

## 2019-05-06 MED ORDER — SODIUM CHLORIDE 0.9 % IV SOLN
200.0000 mg | Freq: Once | INTRAVENOUS | Status: AC
  Administered 2019-05-06: 200 mg via INTRAVENOUS
  Filled 2019-05-06: qty 40

## 2019-05-06 MED ORDER — IOHEXOL 350 MG/ML SOLN
75.0000 mL | Freq: Once | INTRAVENOUS | Status: AC | PRN
  Administered 2019-05-06: 75 mL via INTRAVENOUS

## 2019-05-06 MED ORDER — ALBUTEROL SULFATE HFA 108 (90 BASE) MCG/ACT IN AERS
2.0000 | INHALATION_SPRAY | RESPIRATORY_TRACT | Status: DC | PRN
  Filled 2019-05-06: qty 6.7

## 2019-05-06 MED ORDER — HYDROCODONE-ACETAMINOPHEN 5-325 MG PO TABS: 1.0000 | ORAL_TABLET | ORAL | Status: DC | PRN

## 2019-05-06 MED ORDER — SODIUM CHLORIDE 0.9 % IV SOLN
500.0000 mg | INTRAVENOUS | Status: DC
  Filled 2019-05-06: qty 500

## 2019-05-06 MED ORDER — SODIUM CHLORIDE 0.9 % IV SOLN: 250.0000 mL | INTRAVENOUS | Status: DC | PRN

## 2019-05-06 MED ORDER — POTASSIUM CHLORIDE CRYS ER 20 MEQ PO TBCR
40.0000 meq | EXTENDED_RELEASE_TABLET | Freq: Once | ORAL | Status: AC
  Administered 2019-05-06: 40 meq via ORAL
  Filled 2019-05-06: qty 2

## 2019-05-06 MED ORDER — ACETAMINOPHEN 650 MG RE SUPP: 650.0000 mg | Freq: Four times a day (QID) | RECTAL | Status: DC | PRN

## 2019-05-06 MED ORDER — SODIUM CHLORIDE 0.9 % IV SOLN: 2.0000 g | INTRAVENOUS | Status: DC

## 2019-05-06 MED ORDER — SODIUM CHLORIDE 0.9 % IV SOLN
100.0000 mg | Freq: Every day | INTRAVENOUS | Status: DC
  Administered 2019-05-07 – 2019-05-08 (×2): 100 mg via INTRAVENOUS
  Filled 2019-05-06 (×2): qty 20

## 2019-05-06 MED ORDER — METHYLPREDNISOLONE SODIUM SUCC 125 MG IJ SOLR
60.0000 mg | Freq: Two times a day (BID) | INTRAMUSCULAR | Status: DC
  Administered 2019-05-06 – 2019-05-08 (×4): 60 mg via INTRAVENOUS
  Filled 2019-05-06 (×4): qty 2

## 2019-05-06 MED ORDER — ACETAMINOPHEN 325 MG PO TABS: 650.0000 mg | ORAL_TABLET | Freq: Four times a day (QID) | ORAL | Status: DC | PRN

## 2019-05-06 NOTE — ED Notes (Signed)
Pt SpO2 drops to 86% o\n 2LNC when he is asleep. O2 is now at [facility] and SpO2 is now at 95%.

## 2019-05-06 NOTE — Progress Notes (Signed)
PROGRESS NOTE                                                                                                                                                                                                             Patient Demographics:    Fernando Lozano, is a 49 y.o. male, DOB - 07/13/1970, [id number]  Outpatient Primary MD for the patient is Patient, No Pcp Per   Admit date - 05/05/2019   LOS - 0  Chief Complaint  Patient presents with  . Cough/Fever       Brief Narrative: Patient is a 49 y.o. male with PMHx of alcohol abuse-but claims that he has not drank alcohol for the past 3-4 months-who presented with a 2-week history of cough, myalgias, dizziness-and lately with worsening exertional dyspnea.  Found to have acute hypoxic respiratory failure secondary to COVID-19 pneumonia.  Significant Events: 3/21>> admit to [facility] for hypoxia from COVID-19.  COVID-19 medications: Remdesivir: 3/22>> Steroids: 3/22>>  Antibiotics: Rocephin: 3/21 x 1 Zithromax: 3/21 x 1  Microbiology data: 3/21: Blood culture>> pending 3/21: Urine culture>> pending 3/21> Covid 19 +ve  DVT prophylaxis: Lovenox  Procedures: None  Consults: None   Subjective:    Fernando Lozano today feels better today-was on 4 L of oxygen when I saw him-but was titrated down to just 1 L.  Continues to have some cough.   Assessment  & Plan :   Acute Hypoxic Resp Failure due to Covid 19 Viral pneumonia: Improving-started on steroids/remdesivir this morning.  Do not think patient had clinical features consistent with sepsis on admission.  Follow inflammatory markers and clinical trajectory.  RN to continue at attempts to titrate down oxygen.  At this time does not require Actemra-however off label use of Actemra discussed with patient-he does not have a history of TB, hepatitis B recurrent diverticulitis.  He is aware of risk of secondary  infection.  If he worsens-he has consented to the off label use of Actemra.  Fever: afebrile  O2 requirements:  SpO2: 94 % O2 Flow Rate (L/min): 1 L/min   COVID-19 Labs: Recent Labs    05/05/19 2350  DDIMER 2.02*  FERRITIN 355*  LDH 323*  CRP 25.9*    No results found for: BNP  Recent Labs  Lab 05/05/19 2350  PROCALCITON 0.44    Lab Results  Component Value Date   SARSCOV2NAA POSITIVE (A) 05/05/2019     Prone/Incentive Spirometry: encouraged  encourage incentive spirometry use 3-4/hour  Hypokalemia: Replete and recheck  History of alcohol use: Claims he has been clean for the past 3-4 months.  No signs of alcohol withdrawal-completely awake and alert at this time.  Follow for now.   ABG:    Component Value Date/Time   TCO2 24 10/31/2016 2111    Vent Settings: N/A  Condition - Stable  Family Communication  :  Spouse updated over the phone-3/22-she was able to speak some [place].  She will have her daughter call the floor if family wants further updates.  Code Status :  Full Code  Diet :  Diet Order            Diet Heart Room service appropriate? Yes; Fluid consistency: Thin  Diet effective now               Disposition Plan  :  Remain hospitalized-when medically stable.  Barriers to discharge: Hypoxia requiring O2 supplementation/complete 5 days of IV Remdesivir  Antimicorbials  :    Anti-infectives (From admission, onward)   Start     Dose/Rate Route Frequency Ordered Stop   05/07/19 1000  remdesivir 100 mg in sodium chloride 0.9 % 100 mL IVPB     100 mg 200 mL/hr over 30 Minutes Intravenous Daily 05/06/19 0739 05/11/19 0959   05/06/19 1900  cefTRIAXone (ROCEPHIN) 2 g in sodium chloride 0.9 % 100 mL IVPB     2 g 200 mL/hr over 30 Minutes Intravenous Every 24 hours 05/06/19 0005 05/11/19 1859   05/06/19 1800  azithromycin (ZITHROMAX) 500 mg in sodium chloride 0.9 % 250 mL IVPB     500 mg 250 mL/hr over 60 Minutes Intravenous Every 24 hours  05/06/19 0005 05/11/19 1759   05/06/19 0900  remdesivir 200 mg in sodium chloride 0.9% 250 mL IVPB     200 mg 580 mL/hr over 30 Minutes Intravenous Once 05/06/19 0739     05/05/19 2315  azithromycin (ZITHROMAX) 500 mg in sodium chloride 0.9 % 250 mL IVPB     500 mg 250 mL/hr over 60 Minutes Intravenous  Once 05/05/19 2228 05/05/19 2342   05/05/19 2245  cefTRIAXone (ROCEPHIN) 1 g in sodium chloride 0.9 % 100 mL IVPB     1 g 200 mL/hr over 30 Minutes Intravenous  Once 05/05/19 2228 05/05/19 2319      Inpatient Medications  Scheduled Meds: . enoxaparin (LOVENOX) injection  40 mg Subcutaneous Q24H  . methylPREDNISolone (SOLU-MEDROL) injection  60 mg Intravenous Q12H  . sodium chloride flush  3 mL Intravenous Q12H   Continuous Infusions: . sodium chloride    . azithromycin    . cefTRIAXone (ROCEPHIN)  IV    . remdesivir 200 mg in sodium chloride 0.9% 250 mL IVPB     Followed by  . [START ON 05/07/2019] remdesivir 100 mg in NS 100 mL     PRN Meds:.sodium chloride, acetaminophen **OR** acetaminophen, HYDROcodone-acetaminophen, ondansetron **OR** ondansetron (ZOFRAN) IV, sodium chloride flush   Time Spent in minutes  25  See all Orders from today for further details   Oren Binet M.D on 05/06/2019 at 9:55 AM  To page go to [email or web address] - use universal password  [facility] -  Office  [phone]    Objective:   Vitals:   05/06/19 0500 05/06/19 0515 05/06/19 0615 05/06/19 0835  BP: 121/76 (!) 103/52 126/78 117/72  Pulse: 71  70 82 71  Resp: (!) 24 (!) 25  19  Temp:   98.9 F (37.2 C) 98.8 F (37.1 C)  TempSrc:   Oral Oral  SpO2: 91% 95% 95% 94%  Weight:      Height:        Wt Readings from Last 3 Encounters:  05/05/19 72.6 kg     Intake/Output Summary (Last 24 hours) at 05/06/2019 0955 Last data filed at 05/06/2019 0600 Gross per 24 hour  Intake 250 ml  Output --  Net 250 ml     Physical Exam Gen Exam:Alert awake-not in any  distress HEENT:atraumatic, normocephalic Chest: B/L clear to auscultation anteriorly CVS:S1S2 regular Abdomen:soft non tender, non distended Extremities:no edema Neurology: Non focal Skin: no rash   Data Review:    CBC Recent Labs  Lab 05/05/19 1927 05/05/19 2156 05/06/19 0425  WBC 10.8* 11.1* 10.0  HGB 13.4 13.1 12.1*  HCT 38.8* 36.6* 35.2*  PLT 487* 491* 500*  MCV 87.6 89.3 87.3  MCH 30.2 32.0 30.0  MCHC 34.5 35.8 34.4  RDW 13.1 13.2 13.2  LYMPHSABS 1.3 1.5  --   MONOABS 0.7 0.7  --   EOSABS 0.5 0.5  --   BASOSABS 0.1 0.1  --     Chemistries  Recent Labs  Lab 05/05/19 1927 05/05/19 2156 05/06/19 0009  NA 133* 133* 134*  K 3.4* 3.5 3.3*  CL 100 99 100  CO2 18* 20* 20*  GLUCOSE 135* 106* 99  BUN 13 13 13   CREATININE 0.99 0.96 0.84  CALCIUM 8.3* 8.3* 8.0*  MG  --   --  2.0  AST 21 23 21   ALT 24 23 20   ALKPHOS 74 74 73  BILITOT 1.0 1.2 0.4   ------------------------------------------------------------------------------------------------------------------ No results for input(s): CHOL, HDL, LDLCALC, TRIG, CHOLHDL, LDLDIRECT in the last 72 hours.  No results found for: HGBA1C ------------------------------------------------------------------------------------------------------------------ Recent Labs    05/05/19 2350  TSH 0.917   ------------------------------------------------------------------------------------------------------------------ Recent Labs    05/05/19 2350  FERRITIN 355*    Coagulation profile Recent Labs  Lab 05/05/19 2156  INR 1.1    Recent Labs    05/05/19 2350  DDIMER 2.02*    Cardiac Enzymes No results for input(s): CKMB, TROPONINI, MYOGLOBIN in the last 168 hours.  Invalid input(s): CK ------------------------------------------------------------------------------------------------------------------ No results found for: BNP  Micro Results Recent Results (from the past 240 hour(s))  SARS CORONAVIRUS 2 (TAT 6-24  HRS) Nasopharyngeal Nasopharyngeal Swab     Status: Abnormal   Collection Time: 05/05/19 10:28 PM   Specimen: Nasopharyngeal Swab  Result Value Ref Range Status   SARS Coronavirus 2 POSITIVE (A) NEGATIVE Final    Comment: RESULT CALLED TO, READ BACK BY AND VERIFIED WITH: RN ANDREW CAIN AT [facility] ON 05/06/2019 (NOTE) SARS-CoV-2 target nucleic acids are DETECTED. The SARS-CoV-2 RNA is generally detectable in upper and lower respiratory specimens during the acute phase of infection. Positive results are indicative of the presence of SARS-CoV-2 RNA. Clinical correlation with patient history and other diagnostic information is  necessary to determine patient infection status. Positive results do not rule out bacterial infection or co-infection with other viruses.  The expected result is Negative. Fact Sheet for Patients: SugarRoll.be Fact Sheet for Healthcare Providers: https://www.woods-mathews.com/ This test is not yet approved or cleared by the [place] FDA and  has been authorized for detection and/or diagnosis of SARS-CoV-2 by FDA under an Emergency Use Authorization (EUA). This EUA will remain  in effect (meaning  this test  can be used) for the duration of the COVID-19 declaration under Section 564(b)(1) of the Act, 21 U.S.C. section 360bbb-3(b)(1), unless the authorization is terminated or revoked sooner. Performed at [facility], [place] [address] [place], [place] [address]     Radiology Reports CT Angio Chest PE W and/or Wo Contrast  Result Date: 05/06/2019 CLINICAL DATA:  Persistent productive cough and chest congestion EXAM: CT ANGIOGRAPHY CHEST WITH CONTRAST TECHNIQUE: Multidetector CT imaging of the chest was performed using the standard protocol during bolus administration of intravenous contrast. Multiplanar CT image reconstructions and MIPs were obtained to evaluate the vascular anatomy. CONTRAST:  31mL  OMNIPAQUE IOHEXOL 350 MG/ML SOLN COMPARISON:  None. FINDINGS: Cardiovascular: There is a optimal opacification of the pulmonary arteries. There is no central,segmental, or subsegmental filling defects within the pulmonary arteries. The heart is normal in size. No pericardial effusion or thickening. No evidence right heart strain. There is normal three-vessel brachiocephalic anatomy without proximal stenosis. Scattered vascular calcifications are seen at the aortic arch. Mediastinum/Nodes: No hilar, mediastinal, or axillary adenopathy. Thyroid gland, trachea, and esophagus demonstrate no significant findings. Lungs/Pleura: Extensive multifocal patchy airspace opacities are seen throughout both lungs. No pleural effusion is seen. Upper Abdomen: No acute abnormalities present in the visualized portions of the upper abdomen. Musculoskeletal: No chest wall abnormality. No acute or significant osseous findings. Review of the MIP images confirms the above findings. IMPRESSION: No central, segmental, or subsegmental pulmonary embolism. Multifocal patchy ground-glass opacities throughout both lungs, consistent with atypical viral pneumonia. Electronically Signed   By: Prudencio Pair M.D.   On: 05/06/2019 05:43   DG Chest Portable 1 View  Result Date: 05/05/2019 CLINICAL DATA:  Fever, cough and congestion. EXAM: PORTABLE CHEST 1 VIEW COMPARISON:  October 31, 2016 FINDINGS: Mild diffuse bilateral infiltrates are seen involving predominantly the lateral aspects of both lungs. There is no evidence of a pleural effusion or pneumothorax. The heart size and mediastinal contours are within normal limits. The visualized skeletal structures are unremarkable. IMPRESSION: Mild diffuse bilateral infiltrates. Electronically Signed   By: Virgina Norfolk M.D.   On: 05/05/2019 21:14

## 2019-05-06 NOTE — ED Notes (Signed)
Patient is having Spo2 readings blow 93% on RA with good pleth. 2LNC applied to patient. Sp02 is now WNL at 98%

## 2019-05-06 NOTE — Plan of Care (Signed)

## 2019-05-07 LAB — CBC
HCT: 35.1 % — ABNORMAL LOW (ref 39.0–52.0)
Hemoglobin: 12.2 g/dL — ABNORMAL LOW (ref 13.0–17.0)
MCH: 30.8 pg (ref 26.0–34.0)
MCHC: 34.8 g/dL (ref 30.0–36.0)
MCV: 88.6 fL (ref 80.0–100.0)
Platelets: 577 10*3/uL — ABNORMAL HIGH (ref 150–400)
RBC: 3.96 MIL/uL — ABNORMAL LOW (ref 4.22–5.81)
RDW: 13.2 % (ref 11.5–15.5)
WBC: 6.8 10*3/uL (ref 4.0–10.5)
nRBC: 0 % (ref 0.0–0.2)

## 2019-05-07 LAB — COMPREHENSIVE METABOLIC PANEL
ALT: 23 U/L (ref 0–44)
AST: 20 U/L (ref 15–41)
Albumin: 2.4 g/dL — ABNORMAL LOW (ref 3.5–5.0)
Alkaline Phosphatase: 79 U/L (ref 38–126)
Anion gap: 8 (ref 5–15)
BUN: 17 mg/dL (ref 6–20)
CO2: 21 mmol/L — ABNORMAL LOW (ref 22–32)
Calcium: 8.4 mg/dL — ABNORMAL LOW (ref 8.9–10.3)
Chloride: 107 mmol/L (ref 98–111)
Creatinine, Ser: 0.76 mg/dL (ref 0.61–1.24)
GFR calc Af Amer: >60 mL/min (ref >60)
GFR calc non Af Amer: >60 mL/min (ref >60)
Glucose, Bld: 227 mg/dL — ABNORMAL HIGH (ref 70–99)
Potassium: 3.9 mmol/L (ref 3.5–5.1)
Sodium: 136 mmol/L (ref 135–145)
Total Bilirubin: 0.5 mg/dL (ref 0.3–1.2)
Total Protein: 6.4 g/dL — ABNORMAL LOW (ref 6.5–8.1)

## 2019-05-07 LAB — GLUCOSE, CAPILLARY
Glucose-Capillary: 172 mg/dL — ABNORMAL HIGH (ref 70–99)
Glucose-Capillary: 191 mg/dL — ABNORMAL HIGH (ref 70–99)
Glucose-Capillary: 195 mg/dL — ABNORMAL HIGH (ref 70–99)
Glucose-Capillary: 224 mg/dL — ABNORMAL HIGH (ref 70–99)

## 2019-05-07 LAB — HEMOGLOBIN A1C
Hgb A1c MFr Bld: 6.3 % — ABNORMAL HIGH (ref 4.8–5.6)
Mean Plasma Glucose: 134.11 mg/dL

## 2019-05-07 LAB — FERRITIN: Ferritin: 373 ng/mL — ABNORMAL HIGH (ref 24–336)

## 2019-05-07 LAB — C-REACTIVE PROTEIN: CRP: 19 mg/dL — ABNORMAL HIGH (ref <1.0)

## 2019-05-07 LAB — D-DIMER, QUANTITATIVE: D-Dimer, Quant: 1.2 ug/mL-FEU — ABNORMAL HIGH (ref 0.00–0.50)

## 2019-05-07 LAB — PROCALCITONIN: Procalcitonin: 0.17 ng/mL

## 2019-05-07 MED ORDER — INSULIN ASPART 100 UNIT/ML ~~LOC~~ SOLN
0.0000 [IU] | Freq: Three times a day (TID) | SUBCUTANEOUS | Status: DC
  Administered 2019-05-07 – 2019-05-08 (×5): 2 [IU] via SUBCUTANEOUS

## 2019-05-07 NOTE — Progress Notes (Signed)
Patient's sp02 was 87 on 2 L of oxygen. Encourage patient to sleep in prone position.

## 2019-05-07 NOTE — Progress Notes (Signed)
PROGRESS NOTE                                                                                                                                                                                                             Patient Demographics:    Fernando Lozano, is a 49 y.o. male, DOB - [birth year], NI:[id number]  Outpatient Primary MD for the patient is Patient, No Pcp Per   Admit date - 05/05/2019   LOS - 1  Chief Complaint  Patient presents with  . Cough/Fever       Brief Narrative: Patient is a 49 y.o. male with PMHx of alcohol abuse-but claims that he has not drank alcohol for the past 3-4 months-who presented with a 2-week history of cough, myalgias, dizziness-and lately with worsening exertional dyspnea.  Found to have acute hypoxic respiratory failure secondary to COVID-19 pneumonia.  Significant Events: 3/21>> admit to [facility] for hypoxia from COVID-19.  COVID-19 medications: Remdesivir: 3/22>> Steroids: 3/22>>  Antibiotics: Rocephin: 3/21 x 1 Zithromax: 3/21 x 1  Microbiology data: 3/21: Blood culture>> pending 3/21: Urine culture>> pending 3/21> Covid 19 +ve  DVT prophylaxis: Lovenox  Procedures: None  Consults: None   Subjective:   Feels better-titrated down to room air today.  Claims he does not get very short of breath when he moves around in the room.    Assessment  & Plan :   Acute Hypoxic Resp Failure due to Covid 19 Viral pneumonia: Clinical improvement continues-on room air.  Continue steroids/remdesivir.  CRP is downtrending.    Fever: afebrile  O2 requirements:  SpO2: 96 % O2 Flow Rate (L/min): 1 L/min   COVID-19 Labs: Recent Labs    05/05/19 2350 05/07/19 0133  DDIMER 2.02* 1.20*  FERRITIN 355* 373*  LDH 323*  --   CRP 25.9* 19.0*    No results found for: BNP  Recent Labs  Lab 05/05/19 2350 05/06/19 0009 05/07/19 0133  PROCALCITON 0.44 0.40 0.17    Lab  Results  Component Value Date   SARSCOV2NAA POSITIVE (A) 05/05/2019     Prone/Incentive Spirometry: encouraged  encourage incentive spirometry use 3-4/hour  Hypokalemia: Repleted.  Prediabetes with mild hyperglycemia secondary to steroids (A1c 6.3 on 3/23): Continue SSI-we will encourage weight loss/exercise/diet regimen on discharge.  History of alcohol use: Claims he has been clean for the past 3-4  months.  No signs of alcohol withdrawal-completely awake and alert at this time.  Follow for now.   ABG:    Component Value Date/Time   TCO2 24 10/31/2016 2111    Vent Settings: N/A  Condition - Stable  Family Communication  :  Spouse updated over the phone-3/23  Code Status :  Full Code  Diet :  Diet Order            Diet Heart Room service appropriate? Yes; Fluid consistency: Thin  Diet effective now               Disposition Plan  :  Remain hospitalized-when medically stable.  Barriers to discharge: Hypoxia requiring O2 supplementation/complete 5 days of IV Remdesivir  Antimicorbials  :    Anti-infectives (From admission, onward)   Start     Dose/Rate Route Frequency Ordered Stop   05/07/19 1000  remdesivir 100 mg in sodium chloride 0.9 % 100 mL IVPB     100 mg 200 mL/hr over 30 Minutes Intravenous Daily 05/06/19 0739 05/11/19 0959   05/06/19 1900  cefTRIAXone (ROCEPHIN) 2 g in sodium chloride 0.9 % 100 mL IVPB  Status:  Discontinued     2 g 200 mL/hr over 30 Minutes Intravenous Every 24 hours 05/06/19 0005 05/06/19 1007   05/06/19 1800  azithromycin (ZITHROMAX) 500 mg in sodium chloride 0.9 % 250 mL IVPB  Status:  Discontinued     500 mg 250 mL/hr over 60 Minutes Intravenous Every 24 hours 05/06/19 0005 05/06/19 1007   05/06/19 0900  remdesivir 200 mg in sodium chloride 0.9% 250 mL IVPB     200 mg 580 mL/hr over 30 Minutes Intravenous Once 05/06/19 0739 05/06/19 1107   05/05/19 2315  azithromycin (ZITHROMAX) 500 mg in sodium chloride 0.9 % 250 mL IVPB      500 mg 250 mL/hr over 60 Minutes Intravenous  Once 05/05/19 2228 05/05/19 2342   05/05/19 2245  cefTRIAXone (ROCEPHIN) 1 g in sodium chloride 0.9 % 100 mL IVPB     1 g 200 mL/hr over 30 Minutes Intravenous  Once 05/05/19 2228 05/05/19 2319      Inpatient Medications  Scheduled Meds: . benzonatate  200 mg Oral TID  . enoxaparin (LOVENOX) injection  40 mg Subcutaneous Q24H  . insulin aspart  0-9 Units Subcutaneous TID WC  . methylPREDNISolone (SOLU-MEDROL) injection  60 mg Intravenous Q12H  . sodium chloride flush  3 mL Intravenous Q12H   Continuous Infusions: . sodium chloride    . remdesivir 100 mg in NS 100 mL 100 mg (05/07/19 0930)   PRN Meds:.sodium chloride, acetaminophen **OR** acetaminophen, albuterol, chlorpheniramine-HYDROcodone, HYDROcodone-acetaminophen, ondansetron **OR** ondansetron (ZOFRAN) IV, sodium chloride flush   Time Spent in minutes  25  See all Orders from today for further details   Oren Binet M.D on 05/07/2019 at 12:59 PM  To page go to [email or web address] - use universal password  [facility] -  Office  [phone]    Objective:   Vitals:   05/07/19 0548 05/07/19 1006 05/07/19 1008 05/07/19 1215  BP: 112/78  117/73 139/85  Pulse: 66 67 62 69  Resp:  (!) 23  19  Temp: 98 F (36.7 C) (!) 97.4 F (36.3 C)  98.3 F (36.8 C)  TempSrc: Oral Oral  Oral  SpO2: 97% 91%  96%  Weight:      Height:        Wt Readings from Last 3 Encounters:  05/05/19 72.6 kg  Intake/Output Summary (Last 24 hours) at 05/07/2019 1259 Last data filed at 05/07/2019 0820 Gross per 24 hour  Intake 360 ml  Output --  Net 360 ml     Physical Exam Gen Exam:Alert awake-not in any distress HEENT:atraumatic, normocephalic Chest: B/L clear to auscultation anteriorly CVS:S1S2 regular Abdomen:soft non tender, non distended Extremities:no edema Neurology: Non focal Skin: no rash   Data Review:    CBC Recent Labs  Lab 05/05/19 1927 05/05/19 2156  05/06/19 0425 05/07/19 0133  WBC 10.8* 11.1* 10.0 6.8  HGB 13.4 13.1 12.1* 12.2*  HCT 38.8* 36.6* 35.2* 35.1*  PLT 487* 491* 500* 577*  MCV 87.6 89.3 87.3 88.6  MCH 30.2 32.0 30.0 30.8  MCHC 34.5 35.8 34.4 34.8  RDW 13.1 13.2 13.2 13.2  LYMPHSABS 1.3 1.5  --   --   MONOABS 0.7 0.7  --   --   EOSABS 0.5 0.5  --   --   BASOSABS 0.1 0.1  --   --     Chemistries  Recent Labs  Lab 05/05/19 1927 05/05/19 2156 05/06/19 0009 05/07/19 0133  NA 133* 133* 134* 136  K 3.4* 3.5 3.3* 3.9  CL 100 99 100 107  CO2 18* 20* 20* 21*  GLUCOSE 135* 106* 99 227*  BUN 13 13 13 17   CREATININE 0.99 0.96 0.84 0.76  CALCIUM 8.3* 8.3* 8.0* 8.4*  MG  --   --  2.0  --   AST 21 23 21 20   ALT 24 23 20 23   ALKPHOS 74 74 73 79  BILITOT 1.0 1.2 0.4 0.5   ------------------------------------------------------------------------------------------------------------------ No results for input(s): CHOL, HDL, LDLCALC, TRIG, CHOLHDL, LDLDIRECT in the last 72 hours.  Lab Results  Component Value Date   HGBA1C 6.3 (H) 05/07/2019   ------------------------------------------------------------------------------------------------------------------ Recent Labs    05/05/19 2350  TSH 0.917   ------------------------------------------------------------------------------------------------------------------ Recent Labs    05/05/19 2350 05/07/19 0133  FERRITIN 355* 373*    Coagulation profile Recent Labs  Lab 05/05/19 2156  INR 1.1    Recent Labs    05/05/19 2350 05/07/19 0133  DDIMER 2.02* 1.20*    Cardiac Enzymes No results for input(s): CKMB, TROPONINI, MYOGLOBIN in the last 168 hours.  Invalid input(s): CK ------------------------------------------------------------------------------------------------------------------ No results found for: BNP  Micro Results Recent Results (from the past 240 hour(s))  Culture, blood (single)     Status: None (Preliminary result)   Collection Time:  05/05/19  9:57 PM   Specimen: BLOOD  Result Value Ref Range Status   Specimen Description BLOOD LEFT ANTECUBITAL  Final   Special Requests   Final    BOTTLES DRAWN AEROBIC AND ANAEROBIC Blood Culture adequate volume   Culture NO GROWTH 2 DAYS  Final   Report Status PENDING  Incomplete  SARS CORONAVIRUS 2 (TAT 6-24 HRS) Nasopharyngeal Nasopharyngeal Swab     Status: Abnormal   Collection Time: 05/05/19 10:28 PM   Specimen: Nasopharyngeal Swab  Result Value Ref Range Status   SARS Coronavirus 2 POSITIVE (A) NEGATIVE Final    Comment: RESULT CALLED TO, READ BACK BY AND VERIFIED WITH: RN ANDREW CAIN AT 0336 BY MESSAN HOUEGNIFIO ON 05/06/2019 (NOTE) SARS-CoV-2 target nucleic acids are DETECTED. The SARS-CoV-2 RNA is generally detectable in upper and lower respiratory specimens during the acute phase of infection. Positive results are indicative of the presence of SARS-CoV-2 RNA. Clinical correlation with patient history and other diagnostic information is  necessary to determine patient infection status. Positive results do not rule out  bacterial infection or co-infection with other viruses.  The expected result is Negative. Fact Sheet for Patients: SugarRoll.be Fact Sheet for Healthcare Providers: https://www.woods-mathews.com/ This test is not yet approved or cleared by the [place] FDA and  has been authorized for detection and/or diagnosis of SARS-CoV-2 by FDA under an Emergency Use Authorization (EUA). This EUA will remain  in effect (meaning this test  can be used) for the duration of the COVID-19 declaration under Section 564(b)(1) of the Act, 21 U.S.C. section 360bbb-3(b)(1), unless the authorization is terminated or revoked sooner. Performed at [facility], [place] [address], [place], [place] [address]   Urine culture     Status: None   Collection Time: 05/05/19 10:53 PM   Specimen: Urine, Clean Catch  Result Value Ref Range  Status   Specimen Description URINE, CLEAN CATCH  Final   Special Requests NONE  Final   Culture   Final    NO GROWTH Performed at [facility], [place] [address], [place], [place] [address]    Report Status 05/06/2019 FINAL  Final    Radiology Reports CT Angio Chest PE W and/or Wo Contrast  Result Date: 05/06/2019 CLINICAL DATA:  Persistent productive cough and chest congestion EXAM: CT ANGIOGRAPHY CHEST WITH CONTRAST TECHNIQUE: Multidetector CT imaging of the chest was performed using the standard protocol during bolus administration of intravenous contrast. Multiplanar CT image reconstructions and MIPs were obtained to evaluate the vascular anatomy. CONTRAST:  62mL OMNIPAQUE IOHEXOL 350 MG/ML SOLN COMPARISON:  None. FINDINGS: Cardiovascular: There is a optimal opacification of the pulmonary arteries. There is no central,segmental, or subsegmental filling defects within the pulmonary arteries. The heart is normal in size. No pericardial effusion or thickening. No evidence right heart strain. There is normal three-vessel brachiocephalic anatomy without proximal stenosis. Scattered vascular calcifications are seen at the aortic arch. Mediastinum/Nodes: No hilar, mediastinal, or axillary adenopathy. Thyroid gland, trachea, and esophagus demonstrate no significant findings. Lungs/Pleura: Extensive multifocal patchy airspace opacities are seen throughout both lungs. No pleural effusion is seen. Upper Abdomen: No acute abnormalities present in the visualized portions of the upper abdomen. Musculoskeletal: No chest wall abnormality. No acute or significant osseous findings. Review of the MIP images confirms the above findings. IMPRESSION: No central, segmental, or subsegmental pulmonary embolism. Multifocal patchy ground-glass opacities throughout both lungs, consistent with atypical viral pneumonia. Electronically Signed   By: Prudencio Pair M.D.   On: 05/06/2019 05:43   DG Chest Portable 1  View  Result Date: 05/05/2019 CLINICAL DATA:  Fever, cough and congestion. EXAM: PORTABLE CHEST 1 VIEW COMPARISON:  October 31, 2016 FINDINGS: Mild diffuse bilateral infiltrates are seen involving predominantly the lateral aspects of both lungs. There is no evidence of a pleural effusion or pneumothorax. The heart size and mediastinal contours are within normal limits. The visualized skeletal structures are unremarkable. IMPRESSION: Mild diffuse bilateral infiltrates. Electronically Signed   By: Virgina Norfolk M.D.   On: 05/05/2019 21:14

## 2019-05-08 LAB — COMPREHENSIVE METABOLIC PANEL
ALT: 26 U/L (ref 0–44)
AST: 18 U/L (ref 15–41)
Albumin: 2.4 g/dL — ABNORMAL LOW (ref 3.5–5.0)
Alkaline Phosphatase: 72 U/L (ref 38–126)
Anion gap: 11 (ref 5–15)
BUN: 22 mg/dL — ABNORMAL HIGH (ref 6–20)
CO2: 21 mmol/L — ABNORMAL LOW (ref 22–32)
Calcium: 8.6 mg/dL — ABNORMAL LOW (ref 8.9–10.3)
Chloride: 108 mmol/L (ref 98–111)
Creatinine, Ser: 0.78 mg/dL (ref 0.61–1.24)
GFR calc Af Amer: >60 mL/min (ref >60)
GFR calc non Af Amer: >60 mL/min (ref >60)
Glucose, Bld: 206 mg/dL — ABNORMAL HIGH (ref 70–99)
Potassium: 4.2 mmol/L (ref 3.5–5.1)
Sodium: 140 mmol/L (ref 135–145)
Total Bilirubin: 0.5 mg/dL (ref 0.3–1.2)
Total Protein: 5.9 g/dL — ABNORMAL LOW (ref 6.5–8.1)

## 2019-05-08 LAB — D-DIMER, QUANTITATIVE: D-Dimer, Quant: 0.72 ug/mL-FEU — ABNORMAL HIGH (ref 0.00–0.50)

## 2019-05-08 LAB — CBC
HCT: 34.6 % — ABNORMAL LOW (ref 39.0–52.0)
Hemoglobin: 11.8 g/dL — ABNORMAL LOW (ref 13.0–17.0)
MCH: 29.7 pg (ref 26.0–34.0)
MCHC: 34.1 g/dL (ref 30.0–36.0)
MCV: 87.2 fL (ref 80.0–100.0)
Platelets: 693 10*3/uL — ABNORMAL HIGH (ref 150–400)
RBC: 3.97 MIL/uL — ABNORMAL LOW (ref 4.22–5.81)
RDW: 13.4 % (ref 11.5–15.5)
WBC: 17.3 10*3/uL — ABNORMAL HIGH (ref 4.0–10.5)
nRBC: 0 % (ref 0.0–0.2)

## 2019-05-08 LAB — FERRITIN: Ferritin: 295 ng/mL (ref 24–336)

## 2019-05-08 LAB — GLUCOSE, CAPILLARY
Glucose-Capillary: 166 mg/dL — ABNORMAL HIGH (ref 70–99)
Glucose-Capillary: 190 mg/dL — ABNORMAL HIGH (ref 70–99)

## 2019-05-08 LAB — C-REACTIVE PROTEIN: CRP: 7.9 mg/dL — ABNORMAL HIGH (ref <1.0)

## 2019-05-08 MED ORDER — PREDNISONE 10 MG PO TABS: ORAL_TABLET | ORAL | 0 refills | Status: DC

## 2019-05-08 MED ORDER — ALBUTEROL SULFATE HFA 108 (90 BASE) MCG/ACT IN AERS: 2.0000 | INHALATION_SPRAY | RESPIRATORY_TRACT | 0 refills | Status: DC | PRN

## 2019-05-08 MED ORDER — BENZONATATE 200 MG PO CAPS: 200.0000 mg | ORAL_CAPSULE | Freq: Three times a day (TID) | ORAL | 0 refills | Status: DC | PRN

## 2019-05-08 MED ORDER — ALBUTEROL SULFATE HFA 108 (90 BASE) MCG/ACT IN AERS: 2.0000 | INHALATION_SPRAY | RESPIRATORY_TRACT | 0 refills | Status: AC | PRN

## 2019-05-08 MED FILL — predniSONE 10 MG TABS: 10 | 4 days supply | Qty: 10 | Fill #0

## 2019-05-08 MED FILL — ALBUTEROL SULFATE HFA 108 (: 108 (90 BAS | 25 days supply | Qty: 18 | Fill #0

## 2019-05-08 MED FILL — BENZONATATE 200 MG CAPS: 200 | 7 days supply | Qty: 20 | Fill #0

## 2019-05-08 NOTE — Discharge Instructions (Addendum)
You are scheduled for an outpatient infusion of Remdesivir at 10:00 AM on Thursday 3/25 and Friday 3/26.  Please report to [facility] at [address]  Drive to the security guard and tell them you are here for an infusion. They will direct you to the front entrance where we will come and get you.  For questions call [phone].  Thanks        Person Under Monitoring Name: Fernando Lozano  Location: [address] [place] [address]   Infection Prevention Recommendations for Individuals Confirmed to have, or Being Evaluated for, 2019 Novel Coronavirus (COVID-19) Infection Who Receive Care at Home  Individuals who are confirmed to have, or are being evaluated for, COVID-19 should follow the prevention steps below until a healthcare provider or local or state health department says they can return to normal activities.  Stay home except to get medical care You should restrict activities outside your home, except for getting medical care. Do not go to work, school, or public areas, and do not use public transportation or taxis.  Call ahead before visiting your doctor Before your medical appointment, call the healthcare provider and tell them that you have, or are being evaluated for, COVID-19 infection. This will help the healthcare provider's office take steps to keep other people from getting infected. Ask your healthcare provider to call the local or state health department.  Monitor your symptoms Seek prompt medical attention if your illness is worsening (e.g., difficulty breathing). Before going to your medical appointment, call the healthcare provider and tell them that you have, or are being evaluated for, COVID-19 infection. Ask your healthcare provider to call the local or state health department.  Wear a facemask You should wear a facemask that covers your nose and mouth when you are in the same room with other people and when you visit a  healthcare provider. People who live with or visit you should also wear a facemask while they are in the same room with you.  Separate yourself from other people in your home As much as possible, you should stay in a different room from other people in your home. Also, you should use a separate bathroom, if available.  Avoid sharing household items You should not share dishes, drinking glasses, cups, eating utensils, towels, bedding, or other items with other people in your home. After using these items, you should wash them thoroughly with soap and water.  Cover your coughs and sneezes Cover your mouth and nose with a tissue when you cough or sneeze, or you can cough or sneeze into your sleeve. Throw used tissues in a lined trash can, and immediately wash your hands with soap and water for at least 20 seconds or use an alcohol-based hand rub.  Wash your [facility] your hands often and thoroughly with soap and water for at least 20 seconds. You can use an alcohol-based hand sanitizer if soap and water are not available and if your hands are not visibly dirty. Avoid touching your eyes, nose, and mouth with unwashed hands.   Prevention Steps for Caregivers and Household Members of Individuals Confirmed to have, or Being Evaluated for, COVID-19 Infection Being Cared for in the Home  If you live with, or provide care at home for, a person confirmed to have, or being evaluated for, COVID-19 infection please follow these guidelines to prevent infection:  Follow healthcare provider's instructions Make sure that you understand and can help the patient follow any  healthcare provider instructions for all care.  Provide for the patient's basic needs You should help the patient with basic needs in the home and provide support for getting groceries, prescriptions, and other personal needs.  Monitor the patient's symptoms If they are getting sicker, call his or her medical provider and tell  them that the patient has, or is being evaluated for, COVID-19 infection. This will help the healthcare provider's office take steps to keep other people from getting infected. Ask the healthcare provider to call the local or state health department.  Limit the number of people who have contact with the patient  If possible, have only one caregiver for the patient.  Other household members should stay in another home or place of residence. If this is not possible, they should stay  in another room, or be separated from the patient as much as possible. Use a separate bathroom, if available.  Restrict visitors who do not have an essential need to be in the home.  Keep older adults, very young children, and other sick people away from the patient Keep older adults, very young children, and those who have compromised immune systems or chronic health conditions away from the patient. This includes people with chronic heart, lung, or kidney conditions, diabetes, and cancer.  Ensure good ventilation Make sure that shared spaces in the home have good air flow, such as from an air conditioner or an opened window, weather permitting.  Wash your hands often  Wash your hands often and thoroughly with soap and water for at least 20 seconds. You can use an alcohol based hand sanitizer if soap and water are not available and if your hands are not visibly dirty.  Avoid touching your eyes, nose, and mouth with unwashed hands.  Use disposable paper towels to dry your hands. If not available, use dedicated cloth towels and replace them when they become wet.  Wear a facemask and gloves  Wear a disposable facemask at all times in the room and gloves when you touch or have contact with the patient's blood, body fluids, and/or secretions or excretions, such as sweat, saliva, sputum, nasal mucus, vomit, urine, or feces.  Ensure the mask fits over your nose and mouth tightly, and do not touch it during  use.  Throw out disposable facemasks and gloves after using them. Do not reuse.  Wash your hands immediately after removing your facemask and gloves.  If your personal clothing becomes contaminated, carefully remove clothing and launder. Wash your hands after handling contaminated clothing.  Place all used disposable facemasks, gloves, and other waste in a lined container before disposing them with other household waste.  Remove gloves and wash your hands immediately after handling these items.  Do not share dishes, glasses, or other household items with the patient  Avoid sharing household items. You should not share dishes, drinking glasses, cups, eating utensils, towels, bedding, or other items with a patient who is confirmed to have, or being evaluated for, COVID-19 infection.  After the person uses these items, you should wash them thoroughly with soap and water.  Wash laundry thoroughly  Immediately remove and wash clothes or bedding that have blood, body fluids, and/or secretions or excretions, such as sweat, saliva, sputum, nasal mucus, vomit, urine, or feces, on them.  Wear gloves when handling laundry from the patient.  Read and follow directions on labels of laundry or clothing items and detergent. In general, wash and dry with the warmest temperatures recommended on the  label.  Clean all areas the individual has used often  Clean all touchable surfaces, such as counters, tabletops, doorknobs, bathroom fixtures, toilets, phones, keyboards, tablets, and bedside tables, every day. Also, clean any surfaces that may have blood, body fluids, and/or secretions or excretions on them.  Wear gloves when cleaning surfaces the patient has come in contact with.  Use a diluted bleach solution (e.g., dilute bleach with 1 part bleach and 10 parts water) or a household disinfectant with a label that says EPA-registered for coronaviruses. To make a bleach solution at home, add 1 tablespoon  of bleach to 1 quart (4 cups) of water. For a larger supply, add  cup of bleach to 1 gallon (16 cups) of water.  Read labels of cleaning products and follow recommendations provided on product labels. Labels contain instructions for safe and effective use of the cleaning product including precautions you should take when applying the product, such as wearing gloves or eye protection and making sure you have good ventilation during use of the product.  Remove gloves and wash hands immediately after cleaning.  Monitor yourself for signs and symptoms of illness Caregivers and household members are considered close contacts, should monitor their health, and will be asked to limit movement outside of the home to the extent possible. Follow the monitoring steps for close contacts listed on the symptom monitoring form.   ? If you have additional questions, contact your local health department or call the epidemiologist on call at [phone] (available 24/7). ? This guidance is subject to change. For the most up-to-date guidance from [facility], please refer to their website: YouBlogs.pl

## 2019-05-08 NOTE — Discharge Summary (Signed)
PATIENT DETAILS Name: Fernando Lozano Age: 49 y.o. Sex: male Date of Birth: [birth year] MRN: [id number]. Admitting Physician: Nadeen Landau, MD QP:[id number], No Pcp Per  Admit Date: 05/05/2019 Discharge date: 05/08/2019  Recommendations for Outpatient Follow-up:  1. Follow up with PCP in 1-2 weeks 2. Please obtain CMP/CBC in one week 3. Repeat Chest Xray in 4-6 week  Admitted From:  Home  Disposition: [place]: No  Equipment/Devices: None  Discharge Condition: Stable  CODE STATUS: FULL CODE  Diet recommendation:  Diet Order            Diet - low sodium heart healthy        Diet Heart Room service appropriate? Yes; Fluid consistency: Thin  Diet effective now               Brief Narrative: Patient is a 49 y.o. male with PMHx of alcohol abuse-but claims that he has not drank alcohol for the past 3-4 months-who presented with a 2-week history of cough, myalgias, dizziness-and lately with worsening exertional dyspnea.  Found to have acute hypoxic respiratory failure secondary to COVID-19 pneumonia.  Significant Events: 3/21>> admit to [facility] for hypoxia from COVID-19.  COVID-19 medications: Remdesivir: 3/22>> Steroids: 3/22>>  Antibiotics: Rocephin: 3/21 x 1 Zithromax: 3/21 x 1  Microbiology data: 3/21: Blood culture>> no growth so far 3/21: Urine culture>> no growth  3/21> Covid 19 +ve  DVT prophylaxis: Lovenox  Procedures: None  Consults: None  Brief Hospital Course: Acute Hypoxic Resp Failure due to Covid 19 Viral pneumonia:  Rapidly improved after starting steroids and remdesivir.  CRP is down trended significantly.  He has been titrated to room air-he was ambulated by RN on the day of discharge-does not require home O2.  Since he has improved rapidly-is on room air for more than 24 hours-he is felt to be stable to discharge to continue remdesivir x2 more doses in the outpatient setting at the infusion center.  Patient is  agreeable with this plan.  He will continue on tapering steroids for a few more days.   COVID-19 Labs:  Recent Labs    05/05/19 2350 05/07/19 0133 05/08/19 0335  DDIMER 2.02* 1.20* 0.72*  FERRITIN 355* 373* 295  LDH 323*  --   --   CRP 25.9* 19.0* 7.9*    Lab Results  Component Value Date   SARSCOV2NAA POSITIVE (A) 05/05/2019     Hypokalemia: Repleted.  Prediabetes with mild hyperglycemia secondary to steroids (A1c 6.3 on 3/23): Continue SSI-we will encourage weight loss/exercise/diet regimen on discharge.  History of alcohol use: Claims he has been clean for the past 3-4 months.  No signs of alcohol withdrawal-completely awake and alert at this time.    Discharge Diagnoses:  Active Problems:   Acute respiratory failure with hypoxia (HCC)   CAP (community acquired pneumonia)   Sepsis [facility])   COVID-19   Discharge Instructions:    Person Under Monitoring Name: Fernando Lozano [facility]  Location: [address] [place] [address]   Infection Prevention Recommendations for Individuals Confirmed to have, or Being Evaluated for, 2019 Novel Coronavirus (COVID-19) Infection Who Receive Care at Home  Individuals who are confirmed to have, or are being evaluated for, COVID-19 should follow the prevention steps below until a healthcare provider or local or state health department says they can return to normal activities.  Stay home except to get medical care You should restrict activities outside your home, except for getting medical care. Do not go  to work, school, or public areas, and do not use public transportation or taxis.  Call ahead before visiting your doctor Before your medical appointment, call the healthcare provider and tell them that you have, or are being evaluated for, COVID-19 infection. This will help the healthcare provider's office take steps to keep other people from getting infected. Ask your healthcare provider to call the local or state  health department.  Monitor your symptoms Seek prompt medical attention if your illness is worsening (e.g., difficulty breathing). Before going to your medical appointment, call the healthcare provider and tell them that you have, or are being evaluated for, COVID-19 infection. Ask your healthcare provider to call the local or state health department.  Wear a facemask You should wear a facemask that covers your nose and mouth when you are in the same room with other people and when you visit a healthcare provider. People who live with or visit you should also wear a facemask while they are in the same room with you.  Separate yourself from other people in your home As much as possible, you should stay in a different room from other people in your home. Also, you should use a separate bathroom, if available.  Avoid sharing household items You should not share dishes, drinking glasses, cups, eating utensils, towels, bedding, or other items with other people in your home. After using these items, you should wash them thoroughly with soap and water.  Cover your coughs and sneezes Cover your mouth and nose with a tissue when you cough or sneeze, or you can cough or sneeze into your sleeve. Throw used tissues in a lined trash can, and immediately wash your hands with soap and water for at least 20 seconds or use an alcohol-based hand rub.  Wash your [facility] your hands often and thoroughly with soap and water for at least 20 seconds. You can use an alcohol-based hand sanitizer if soap and water are not available and if your hands are not visibly dirty. Avoid touching your eyes, nose, and mouth with unwashed hands.   Prevention Steps for Caregivers and Household Members of Individuals Confirmed to have, or Being Evaluated for, COVID-19 Infection Being Cared for in the Home  If you live with, or provide care at home for, a person confirmed to have, or being evaluated for, COVID-19  infection please follow these guidelines to prevent infection:  Follow healthcare provider's instructions Make sure that you understand and can help the patient follow any healthcare provider instructions for all care.  Provide for the patient's basic needs You should help the patient with basic needs in the home and provide support for getting groceries, prescriptions, and other personal needs.  Monitor the patient's symptoms If they are getting sicker, call his or her medical provider and tell them that the patient has, or is being evaluated for, COVID-19 infection. This will help the healthcare provider's office take steps to keep other people from getting infected. Ask the healthcare provider to call the local or state health department.  Limit the number of people who have contact with the patient  If possible, have only one caregiver for the patient.  Other household members should stay in another home or place of residence. If this is not possible, they should stay  in another room, or be separated from the patient as much as possible. Use a separate bathroom, if available.  Restrict visitors who do not have an essential need to be in the home.  Keep older adults, very young children, and other sick people away from the patient Keep older adults, very young children, and those who have compromised immune systems or chronic health conditions away from the patient. This includes people with chronic heart, lung, or kidney conditions, diabetes, and cancer.  Ensure good ventilation Make sure that shared spaces in the home have good air flow, such as from an air conditioner or an opened window, weather permitting.  Wash your hands often  Wash your hands often and thoroughly with soap and water for at least 20 seconds. You can use an alcohol based hand sanitizer if soap and water are not available and if your hands are not visibly dirty.  Avoid touching your eyes, nose, and mouth  with unwashed hands.  Use disposable paper towels to dry your hands. If not available, use dedicated cloth towels and replace them when they become wet.  Wear a facemask and gloves  Wear a disposable facemask at all times in the room and gloves when you touch or have contact with the patient's blood, body fluids, and/or secretions or excretions, such as sweat, saliva, sputum, nasal mucus, vomit, urine, or feces.  Ensure the mask fits over your nose and mouth tightly, and do not touch it during use.  Throw out disposable facemasks and gloves after using them. Do not reuse.  Wash your hands immediately after removing your facemask and gloves.  If your personal clothing becomes contaminated, carefully remove clothing and launder. Wash your hands after handling contaminated clothing.  Place all used disposable facemasks, gloves, and other waste in a lined container before disposing them with other household waste.  Remove gloves and wash your hands immediately after handling these items.  Do not share dishes, glasses, or other household items with the patient  Avoid sharing household items. You should not share dishes, drinking glasses, cups, eating utensils, towels, bedding, or other items with a patient who is confirmed to have, or being evaluated for, COVID-19 infection.  After the person uses these items, you should wash them thoroughly with soap and water.  Wash laundry thoroughly  Immediately remove and wash clothes or bedding that have blood, body fluids, and/or secretions or excretions, such as sweat, saliva, sputum, nasal mucus, vomit, urine, or feces, on them.  Wear gloves when handling laundry from the patient.  Read and follow directions on labels of laundry or clothing items and detergent. In general, wash and dry with the warmest temperatures recommended on the label.  Clean all areas the individual has used often  Clean all touchable surfaces, such as counters, tabletops,  doorknobs, bathroom fixtures, toilets, phones, keyboards, tablets, and bedside tables, every day. Also, clean any surfaces that may have blood, body fluids, and/or secretions or excretions on them.  Wear gloves when cleaning surfaces the patient has come in contact with.  Use a diluted bleach solution (e.g., dilute bleach with 1 part bleach and 10 parts water) or a household disinfectant with a label that says EPA-registered for coronaviruses. To make a bleach solution at home, add 1 tablespoon of bleach to 1 quart (4 cups) of water. For a larger supply, add  cup of bleach to 1 gallon (16 cups) of water.  Read labels of cleaning products and follow recommendations provided on product labels. Labels contain instructions for safe and effective use of the cleaning product including precautions you should take when applying the product, such as wearing gloves or eye protection and making sure you have good ventilation  during use of the product.  Remove gloves and wash hands immediately after cleaning.  Monitor yourself for signs and symptoms of illness Caregivers and household members are considered close contacts, should monitor their health, and will be asked to limit movement outside of the home to the extent possible. Follow the monitoring steps for close contacts listed on the symptom monitoring form.   ? If you have additional questions, contact your local health department or call the epidemiologist on call at [phone] (available 24/7). ? This guidance is subject to change. For the most up-to-date guidance from CDC, please refer to their website: YouBlogs.pl    Activity:  As tolerated    Discharge Instructions    Call MD for:  difficulty breathing, headache or visual disturbances   Complete by: As directed    Call MD for:  extreme fatigue   Complete by: As directed    Call MD for:  persistant dizziness or  light-headedness   Complete by: As directed    Call MD for:  persistant nausea and vomiting   Complete by: As directed    Diet - low sodium heart healthy   Complete by: As directed    Discharge instructions   Complete by: As directed    1) You have been scheduled for outpatient Remdesivir infusion at 10:00 AM on Thursday 3/25 and Friday 3/26.  Please  to report to [facility] at [address]  Drive to the security guard and tell them you are here for an infusion. They will direct you to the front entrance where we will come and get you.  For questions call [phone].  2) 3 weeks of isolation from 05/05/2019   Follow with Primary MD in 1-2 weeks  Please get a complete blood count and chemistry panel checked by your Primary MD at your next visit, and again as instructed by your Primary MD.  Get Medicines reviewed and adjusted: Please take all your medications with you for your next visit with your Primary MD  Laboratory/radiological data: Please request your Primary MD to go over all hospital tests and procedure/radiological results at the follow up, please ask your Primary MD to get all Hospital records sent to his/her office.  In some cases, they will be blood work, cultures and biopsy results pending at the time of your discharge. Please request that your primary care M.D. follows up on these results.  Also Note the following: If you experience worsening of your admission symptoms, develop shortness of breath, life threatening emergency, suicidal or homicidal thoughts you must seek medical attention immediately by calling 911 or calling your MD immediately  if symptoms less severe.  You must read complete instructions/literature along with all the possible adverse reactions/side effects for all the Medicines you take and that have been prescribed to you. Take any new Medicines after you have completely understood and accpet all the possible adverse reactions/side effects.    Do not drive when taking Pain medications or sleeping medications (Benzodaizepines)  Do not take more than prescribed Pain, Sleep and Anxiety Medications. It is not advisable to combine anxiety,sleep and pain medications without talking with your primary care practitioner  Special Instructions: If you have smoked or chewed Tobacco  in the last 2 yrs please stop smoking, stop any regular Alcohol  and or any Recreational drug use.  Wear Seat belts while driving.  Please note: You were cared for by a hospitalist during your hospital stay. Once you are discharged, your  primary care physician will handle any further medical issues. Please note that NO REFILLS for any discharge medications will be authorized once you are discharged, as it is imperative that you return to your primary care physician (or establish a relationship with a primary care physician if you do not have one) for your post hospital discharge needs so that they can reassess your need for medications and monitor your lab values.   Increase activity slowly   Complete by: As directed      Allergies as of 05/08/2019   No Known Allergies     Medication List    STOP taking these medications   HYDROcodone-acetaminophen 5-325 MG tablet Commonly known as: NORCO/VICODIN   ibuprofen 600 MG tablet Commonly known as: ADVIL     TAKE these medications   albuterol 108 (90 Base) MCG/ACT inhaler Commonly known as: VENTOLIN HFA Inhale 2 puffs into the lungs every 4 (four) hours as needed for wheezing or shortness of breath.   benzonatate 200 MG capsule Commonly known as: TESSALON Take 1 capsule (200 mg total) by mouth 3 (three) times daily as needed for cough.   predniSONE 10 MG tablet Commonly known as: DELTASONE Take 40 mg daily for 1 day, 30 mg daily for 1 day, 20 mg daily for 1 days,10 mg daily for 1 day, then stop      Follow-up Information    Primary care MD. Schedule an appointment as soon as possible for a visit in 1  week(s).          No Known Allergies  Other Procedures/Studies: CT Angio Chest PE W and/or Wo Contrast  Result Date: 05/06/2019 CLINICAL DATA:  Persistent productive cough and chest congestion EXAM: CT ANGIOGRAPHY CHEST WITH CONTRAST TECHNIQUE: Multidetector CT imaging of the chest was performed using the standard protocol during bolus administration of intravenous contrast. Multiplanar CT image reconstructions and MIPs were obtained to evaluate the vascular anatomy. CONTRAST:  58mL OMNIPAQUE IOHEXOL 350 MG/ML SOLN COMPARISON:  None. FINDINGS: Cardiovascular: There is a optimal opacification of the pulmonary arteries. There is no central,segmental, or subsegmental filling defects within the pulmonary arteries. The heart is normal in size. No pericardial effusion or thickening. No evidence right heart strain. There is normal three-vessel brachiocephalic anatomy without proximal stenosis. Scattered vascular calcifications are seen at the aortic arch. Mediastinum/Nodes: No hilar, mediastinal, or axillary adenopathy. Thyroid gland, trachea, and esophagus demonstrate no significant findings. Lungs/Pleura: Extensive multifocal patchy airspace opacities are seen throughout both lungs. No pleural effusion is seen. Upper Abdomen: No acute abnormalities present in the visualized portions of the upper abdomen. Musculoskeletal: No chest wall abnormality. No acute or significant osseous findings. Review of the MIP images confirms the above findings. IMPRESSION: No central, segmental, or subsegmental pulmonary embolism. Multifocal patchy ground-glass opacities throughout both lungs, consistent with atypical viral pneumonia. Electronically Signed   By: Prudencio Pair M.D.   On: 05/06/2019 05:43   DG Chest Portable 1 View  Result Date: 05/05/2019 CLINICAL DATA:  Fever, cough and congestion. EXAM: PORTABLE CHEST 1 VIEW COMPARISON:  October 31, 2016 FINDINGS: Mild diffuse bilateral infiltrates are seen involving  predominantly the lateral aspects of both lungs. There is no evidence of a pleural effusion or pneumothorax. The heart size and mediastinal contours are within normal limits. The visualized skeletal structures are unremarkable. IMPRESSION: Mild diffuse bilateral infiltrates. Electronically Signed   By: Virgina Norfolk M.D.   On: 05/05/2019 21:14     TODAY-DAY OF DISCHARGE:  Subjective:   Napolean  Gabay today has no headache,no chest abdominal pain,no new weakness tingling or numbness, feels much better wants to go home today.   Objective:   Blood pressure 113/74, pulse 68, temperature 98.4 F (36.9 C), temperature source Oral, resp. rate 19, height 5\' 2"  (1.575 m), weight 72.6 kg, SpO2 95 %.  Intake/Output Summary (Last 24 hours) at 05/08/2019 1041 Last data filed at 05/08/2019 0814 Gross per 24 hour  Intake 780 ml  Output --  Net 780 ml   Filed Weights   05/05/19 1912  Weight: 72.6 kg    Exam: Awake Alert, Oriented *3, No new F.N deficits, Normal affect [place].AT,PERRAL Supple Neck,No JVD, No cervical lymphadenopathy appriciated.  Symmetrical Chest wall movement, Good air movement bilaterally, CTAB RRR,No Gallops,Rubs or new Murmurs, No Parasternal Heave +ve B.Sounds, Abd Soft, Non tender, No organomegaly appriciated, No rebound -guarding or rigidity. No Cyanosis, Clubbing or edema, No new Rash or bruise   PERTINENT RADIOLOGIC STUDIES: CT Angio Chest PE W and/or Wo Contrast  Result Date: 05/06/2019 CLINICAL DATA:  Persistent productive cough and chest congestion EXAM: CT ANGIOGRAPHY CHEST WITH CONTRAST TECHNIQUE: Multidetector CT imaging of the chest was performed using the standard protocol during bolus administration of intravenous contrast. Multiplanar CT image reconstructions and MIPs were obtained to evaluate the vascular anatomy. CONTRAST:  105mL OMNIPAQUE IOHEXOL 350 MG/ML SOLN COMPARISON:  None. FINDINGS: Cardiovascular: There is a optimal opacification of the pulmonary  arteries. There is no central,segmental, or subsegmental filling defects within the pulmonary arteries. The heart is normal in size. No pericardial effusion or thickening. No evidence right heart strain. There is normal three-vessel brachiocephalic anatomy without proximal stenosis. Scattered vascular calcifications are seen at the aortic arch. Mediastinum/Nodes: No hilar, mediastinal, or axillary adenopathy. Thyroid gland, trachea, and esophagus demonstrate no significant findings. Lungs/Pleura: Extensive multifocal patchy airspace opacities are seen throughout both lungs. No pleural effusion is seen. Upper Abdomen: No acute abnormalities present in the visualized portions of the upper abdomen. Musculoskeletal: No chest wall abnormality. No acute or significant osseous findings. Review of the MIP images confirms the above findings. IMPRESSION: No central, segmental, or subsegmental pulmonary embolism. Multifocal patchy ground-glass opacities throughout both lungs, consistent with atypical viral pneumonia. Electronically Signed   By: Prudencio Pair M.D.   On: 05/06/2019 05:43   DG Chest Portable 1 View  Result Date: 05/05/2019 CLINICAL DATA:  Fever, cough and congestion. EXAM: PORTABLE CHEST 1 VIEW COMPARISON:  October 31, 2016 FINDINGS: Mild diffuse bilateral infiltrates are seen involving predominantly the lateral aspects of both lungs. There is no evidence of a pleural effusion or pneumothorax. The heart size and mediastinal contours are within normal limits. The visualized skeletal structures are unremarkable. IMPRESSION: Mild diffuse bilateral infiltrates. Electronically Signed   By: Virgina Norfolk M.D.   On: 05/05/2019 21:14     PERTINENT LAB RESULTS: CBC: Recent Labs    05/07/19 0133 05/08/19 0335  WBC 6.8 17.3*  HGB 12.2* 11.8*  HCT 35.1* 34.6*  PLT 577* 693*   CMET CMP     Component Value Date/Time   NA 140 05/08/2019 0335   K 4.2 05/08/2019 0335   CL 108 05/08/2019 0335   CO2  21 (L) 05/08/2019 0335   GLUCOSE 206 (H) 05/08/2019 0335   BUN 22 (H) 05/08/2019 0335   CREATININE 0.78 05/08/2019 0335   CALCIUM 8.6 (L) 05/08/2019 0335   PROT 5.9 (L) 05/08/2019 0335   ALBUMIN 2.4 (L) 05/08/2019 0335   AST 18 05/08/2019 0335   ALT  26 05/08/2019 0335   ALKPHOS 72 05/08/2019 0335   BILITOT 0.5 05/08/2019 0335   GFRNONAA >60 05/08/2019 0335   GFRAA >60 05/08/2019 0335    GFR Estimated Creatinine Clearance: 96.6 mL/min (by C-G formula based on SCr of 0.78 mg/dL). No results for input(s): LIPASE, AMYLASE in the last 72 hours. Recent Labs    05/05/19 2224  CKTOTAL 170   Invalid input(s): POCBNP Recent Labs    05/07/19 0133 05/08/19 0335  DDIMER 1.20* 0.72*   Recent Labs    05/07/19 0133  HGBA1C 6.3*   No results for input(s): CHOL, HDL, LDLCALC, TRIG, CHOLHDL, LDLDIRECT in the last 72 hours. Recent Labs    05/05/19 2350  TSH 0.917   Recent Labs    05/07/19 0133 05/08/19 0335  FERRITIN 373* 295   Coags: Recent Labs    05/05/19 2156  INR 1.1   Microbiology: Recent Results (from the past 240 hour(s))  Culture, blood (single)     Status: None (Preliminary result)   Collection Time: 05/05/19  9:57 PM   Specimen: BLOOD  Result Value Ref Range Status   Specimen Description BLOOD LEFT ANTECUBITAL  Final   Special Requests   Final    BOTTLES DRAWN AEROBIC AND ANAEROBIC Blood Culture adequate volume   Culture   Final    NO GROWTH 3 DAYS Performed at [facility], [address] [place], [place] [address]    Report Status PENDING  Incomplete  SARS CORONAVIRUS 2 (TAT 6-24 HRS) Nasopharyngeal Nasopharyngeal Swab     Status: Abnormal   Collection Time: 05/05/19 10:28 PM   Specimen: Nasopharyngeal Swab  Result Value Ref Range Status   SARS Coronavirus 2 POSITIVE (A) NEGATIVE Final    Comment: RESULT CALLED TO, READ BACK BY AND VERIFIED WITH: RN ANDREW CAIN AT [place] ON 05/06/2019 (NOTE) SARS-CoV-2 target nucleic acids  are DETECTED. The SARS-CoV-2 RNA is generally detectable in upper and lower respiratory specimens during the acute phase of infection. Positive results are indicative of the presence of SARS-CoV-2 RNA. Clinical correlation with patient history and other diagnostic information is  necessary to determine patient infection status. Positive results do not rule out bacterial infection or co-infection with other viruses.  The expected result is Negative. Fact Sheet for Patients: SugarRoll.be Fact Sheet for Healthcare Providers: https://www.woods-mathews.com/ This test is not yet approved or cleared by the [place] FDA and  has been authorized for detection and/or diagnosis of SARS-CoV-2 by FDA under an Emergency Use Authorization (EUA). This EUA will remain  in effect (meaning this test  can be used) for the duration of the COVID-19 declaration under Section 564(b)(1) of the Act, 21 U.S.C. section 360bbb-3(b)(1), unless the authorization is terminated or revoked sooner. Performed at [facility], [place] [address] [address], [place], [place] [address]   Urine culture     Status: None   Collection Time: 05/05/19 10:53 PM   Specimen: Urine, Clean Catch  Result Value Ref Range Status   Specimen Description URINE, CLEAN CATCH  Final   Special Requests NONE  Final   Culture   Final    NO GROWTH Performed at [facility], [place] [address] [place], [place] [address]    Report Status 05/06/2019 FINAL  Final    FURTHER DISCHARGE INSTRUCTIONS:  Get Medicines reviewed and adjusted: Please take all your medications with you for your next visit with your Primary MD  Laboratory/radiological data: Please request your Primary MD to  go over all hospital tests and procedure/radiological results at the follow up, please ask your Primary MD to get all Hospital records sent to his/her office.  In some cases, they will be blood work, cultures and biopsy  results pending at the time of your discharge. Please request that your primary care M.D. goes through all the records of your hospital data and follows up on these results.  Also Note the following: If you experience worsening of your admission symptoms, develop shortness of breath, life threatening emergency, suicidal or homicidal thoughts you must seek medical attention immediately by calling 911 or calling your MD immediately  if symptoms less severe.  You must read complete instructions/literature along with all the possible adverse reactions/side effects for all the Medicines you take and that have been prescribed to you. Take any new Medicines after you have completely understood and accpet all the possible adverse reactions/side effects.   Do not drive when taking Pain medications or sleeping medications (Benzodaizepines)  Do not take more than prescribed Pain, Sleep and Anxiety Medications. It is not advisable to combine anxiety,sleep and pain medications without talking with your primary care practitioner  Special Instructions: If you have smoked or chewed Tobacco  in the last 2 yrs please stop smoking, stop any regular Alcohol  and or any Recreational drug use.  Wear Seat belts while driving.  Please note: You were cared for by a hospitalist during your hospital stay. Once you are discharged, your primary care physician will handle any further medical issues. Please note that NO REFILLS for any discharge medications will be authorized once you are discharged, as it is imperative that you return to your primary care physician (or establish a relationship with a primary care physician if you do not have one) for your post hospital discharge needs so that they can reassess your need for medications and monitor your lab values.  Total Time spent coordinating discharge including counseling, education and face to face time equals 35 minutes.  SignedOren Binet 05/08/2019 10:41 AM

## 2019-05-08 NOTE — Progress Notes (Signed)
Patient scheduled for outpatient Remdesivir infusion at 10:00 AM on Thursday 3/25 and Friday 3/26.  Please advise them to report to [facility] at [address]  Drive to the security guard and tell them you are here for an infusion. They will direct you to the front entrance where we will come and get you.  For questions call [phone].  Thanks

## 2019-05-08 NOTE — Care Management (Signed)
Pt deemed stable for discharge home today.  CM spoke with pt via assistance of interpretor.  Pt confirms he is independent from home with his wife.  Pt confirms he doesn't have a PCP nor insurance.  Pt is in agreement for CM to arrange PCP appt with [facility] - please see AVS.  CM explained to pt that appt is a televist app.  Pt also encouraged to request medication assistance during first appt.  Pt unable to pay cost of discharge meds - Pt was MATCHED.  Pt will travel home via private vehicle.  NO other CM needs determined - CM signing off

## 2019-05-08 NOTE — Progress Notes (Signed)
SATURATION QUALIFICATIONS: (This note is used to comply with regulatory documentation for home oxygen)  Patient Saturations on Room Air at Rest = 95%  Patient Saturations on Room Air while Ambulating = 98%%  Pt does not need home o2

## 2019-05-09 ENCOUNTER — Ambulatory Visit (HOSPITAL_COMMUNITY)
Admission: RE | Admit: 2019-05-09 | Discharge: 2019-05-09 | Disposition: A | Discharge status: Home / Self Care | Payer: Self-pay | Source: Ambulatory Visit | Attending: Pulmonary Disease | Admitting: Pulmonary Disease

## 2019-05-09 DIAGNOSIS — Z20822 Contact with and (suspected) exposure to covid-19: Secondary | ICD-10-CM | POA: Insufficient documentation

## 2019-05-09 MED ORDER — SODIUM CHLORIDE 0.9 % IV SOLN
100.0000 mg | Freq: Once | INTRAVENOUS | Status: AC
  Administered 2019-05-09: 100 mg via INTRAVENOUS
  Filled 2019-05-09: qty 20

## 2019-05-09 MED ORDER — FAMOTIDINE IN NACL 20-0.9 MG/50ML-% IV SOLN: 20.0000 mg | Freq: Once | INTRAVENOUS | Status: DC | PRN

## 2019-05-09 MED ORDER — DIPHENHYDRAMINE HCL 50 MG/ML IJ SOLN: 50.0000 mg | Freq: Once | INTRAMUSCULAR | Status: DC | PRN

## 2019-05-09 MED ORDER — SODIUM CHLORIDE 0.9 % IV SOLN: INTRAVENOUS | Status: DC | PRN

## 2019-05-09 MED ORDER — ALBUTEROL SULFATE HFA 108 (90 BASE) MCG/ACT IN AERS: 2.0000 | INHALATION_SPRAY | Freq: Once | RESPIRATORY_TRACT | Status: DC | PRN

## 2019-05-09 MED ORDER — EPINEPHRINE 0.3 MG/0.3ML IJ SOAJ: 0.3000 mg | Freq: Once | INTRAMUSCULAR | Status: DC | PRN

## 2019-05-09 MED ORDER — METHYLPREDNISOLONE SODIUM SUCC 125 MG IJ SOLR: 125.0000 mg | Freq: Once | INTRAMUSCULAR | Status: DC | PRN

## 2019-05-09 NOTE — Discharge Instructions (Signed)
COVID-19 COVID-19 is a respiratory infection that is caused by a virus called severe acute respiratory syndrome coronavirus 2 (SARS-CoV-2). The disease is also known as coronavirus disease or novel coronavirus. In some people, the virus may not cause any symptoms. In others, it may cause a serious infection. The infection can get worse quickly and can lead to complications, such as:  Pneumonia, or infection of the lungs.  Acute respiratory distress syndrome or ARDS. This is a condition in which fluid build-up in the lungs prevents the lungs from filling with air and passing oxygen into the blood.  Acute respiratory failure. This is a condition in which there is not enough oxygen passing from the lungs to the body or when carbon dioxide is not passing from the lungs out of the body.  Sepsis or septic shock. This is a serious bodily reaction to an infection.  Blood clotting problems.  Secondary infections due to bacteria or fungus.  Organ failure. This is when your body's organs stop working. The virus that causes COVID-19 is contagious. This means that it can spread from person to person through droplets from coughs and sneezes (respiratory secretions). What are the causes? This illness is caused by a virus. You may catch the virus by:  Breathing in droplets from an infected person. Droplets can be spread by a person breathing, speaking, singing, coughing, or sneezing.  Touching something, like a table or a doorknob, that was exposed to the virus (contaminated) and then touching your mouth, nose, or eyes. What increases the risk? Risk for infection You are more likely to be infected with this virus if you:  Are within 6 feet (2 meters) of a person with COVID-19.  Provide care for or live with a person who is infected with COVID-19.  Spend time in crowded indoor spaces or live in shared housing. Risk for serious illness You are more likely to become seriously ill from the virus if you:   Are 50 years of age or older. The higher your age, the more you are at risk for serious illness.  Live in a nursing home or long-term care facility.  Have cancer.  Have a long-term (chronic) disease such as: ? Chronic lung disease, including chronic obstructive pulmonary disease or asthma. ? A long-term disease that lowers your body's ability to fight infection (immunocompromised). ? Heart disease, including heart failure, a condition in which the arteries that lead to the heart become narrow or blocked (coronary artery disease), a disease which makes the heart muscle thick, weak, or stiff (cardiomyopathy). ? Diabetes. ? Chronic kidney disease. ? Sickle cell disease, a condition in which red blood cells have an abnormal "sickle" shape. ? Liver disease.  Are obese. What are the signs or symptoms? Symptoms of this condition can range from mild to severe. Symptoms may appear any time from 2 to 14 days after being exposed to the virus. They include:  A fever or chills.  A cough.  Difficulty breathing.  Headaches, body aches, or muscle aches.  Runny or stuffy (congested) nose.  A sore throat.  New loss of taste or smell. Some people may also have stomach problems, such as nausea, vomiting, or diarrhea. Other people may not have any symptoms of COVID-19. How is this diagnosed? This condition may be diagnosed based on:  Your signs and symptoms, especially if: ? You live in an area with a COVID-19 outbreak. ? You recently traveled to or from an area where the virus is common. ? You   provide care for or live with a person who was diagnosed with COVID-19. ? You were exposed to a person who was diagnosed with COVID-19.  A physical exam.  Lab tests, which may include: ? Taking a sample of fluid from the back of your nose and throat (nasopharyngeal fluid), your nose, or your throat using a swab. ? A sample of mucus from your lungs (sputum). ? Blood tests.  Imaging tests, which  may include, X-rays, CT scan, or ultrasound. How is this treated? At present, there is no medicine to treat COVID-19. Medicines that treat other diseases are being used on a trial basis to see if they are effective against COVID-19. Your health care provider will talk with you about ways to treat your symptoms. For most people, the infection is mild and can be managed at home with rest, fluids, and over-the-counter medicines. Treatment for a serious infection usually takes places in a hospital intensive care unit (ICU). It may include one or more of the following treatments. These treatments are given until your symptoms improve.  Receiving fluids and medicines through an IV.  Supplemental oxygen. Extra oxygen is given through a tube in the nose, a face mask, or a hood.  Positioning you to lie on your stomach (prone position). This makes it easier for oxygen to get into the lungs.  Continuous positive airway pressure (CPAP) or bi-level positive airway pressure (BPAP) machine. This treatment uses mild air pressure to keep the airways open. A tube that is connected to a motor delivers oxygen to the body.  Ventilator. This treatment moves air into and out of the lungs by using a tube that is placed in your windpipe.  Tracheostomy. This is a procedure to create a hole in the neck so that a breathing tube can be inserted.  Extracorporeal membrane oxygenation (ECMO). This procedure gives the lungs a chance to recover by taking over the functions of the heart and lungs. It supplies oxygen to the body and removes carbon dioxide. Follow these instructions at home: Lifestyle  If you are sick, stay home except to get medical care. Your health care provider will tell you how long to stay home. Call your health care provider before you go for medical care.  Rest at home as told by your health care provider.  Do not use any products that contain nicotine or tobacco, such as cigarettes, e-cigarettes, and  chewing tobacco. If you need help quitting, ask your health care provider.  Return to your normal activities as told by your health care provider. Ask your health care provider what activities are safe for you. General instructions  Take over-the-counter and prescription medicines only as told by your health care provider.  Drink enough fluid to keep your urine pale yellow.  Keep all follow-up visits as told by your health care provider. This is important. How is this prevented?  There is no vaccine to help prevent COVID-19 infection. However, there are steps you can take to protect yourself and others from this virus. To protect yourself:   Do not travel to areas where COVID-19 is a risk. The areas where COVID-19 is reported change often. To identify high-risk areas and travel restrictions, check the CDC travel website: wwwnc.cdc.gov/travel/notices  If you live in, or must travel to, an area where COVID-19 is a risk, take precautions to avoid infection. ? Stay away from people who are sick. ? Wash your hands often with soap and water for 20 seconds. If soap and water   are not available, use an alcohol-based hand sanitizer. ? Avoid touching your mouth, face, eyes, or nose. ? Avoid going out in public, follow guidance from your state and local health authorities. ? If you must go out in public, wear a cloth face covering or face mask. Make sure your mask covers your nose and mouth. ? Avoid crowded indoor spaces. Stay at least 6 feet (2 meters) away from others. ? Disinfect objects and surfaces that are frequently touched every day. This may include:  Counters and tables.  Doorknobs and light switches.  Sinks and faucets.  Electronics, such as phones, remote controls, keyboards, computers, and tablets. To protect others: If you have symptoms of COVID-19, take steps to prevent the virus from spreading to others.  If you think you have a COVID-19 infection, contact your health care  provider right away. Tell your health care team that you think you may have a COVID-19 infection.  Stay home. Leave your house only to seek medical care. Do not use public transport.  Do not travel while you are sick.  Wash your hands often with soap and water for 20 seconds. If soap and water are not available, use alcohol-based hand sanitizer.  Stay away from other members of your household. Let healthy household members care for children and pets, if possible. If you have to care for children or pets, wash your hands often and wear a mask. If possible, stay in your own room, separate from others. Use a different bathroom.  Make sure that all people in your household wash their hands well and often.  Cough or sneeze into a tissue or your sleeve or elbow. Do not cough or sneeze into your hand or into the air.  Wear a cloth face covering or face mask. Make sure your mask covers your nose and mouth. Where to find more information  Centers for Disease Control and Prevention: www.cdc.gov/coronavirus/2019-ncov/index.html  World Health Organization: www.who.int/health-topics/coronavirus Contact a health care provider if:  You live in or have traveled to an area where COVID-19 is a risk and you have symptoms of the infection.  You have had contact with someone who has COVID-19 and you have symptoms of the infection. Get help right away if:  You have trouble breathing.  You have pain or pressure in your chest.  You have confusion.  You have bluish lips and fingernails.  You have difficulty waking from sleep.  You have symptoms that get worse. These symptoms may represent a serious problem that is an emergency. Do not wait to see if the symptoms will go away. Get medical help right away. Call your local emergency services (911 in the [place] Do not drive yourself to the hospital. Let the emergency medical personnel know if you think you have COVID-19. Summary  COVID-19 is a  respiratory infection that is caused by a virus. It is also known as coronavirus disease or novel coronavirus. It can cause serious infections, such as pneumonia, acute respiratory distress syndrome, acute respiratory failure, or sepsis.  The virus that causes COVID-19 is contagious. This means that it can spread from person to person through droplets from breathing, speaking, singing, coughing, or sneezing.  You are more likely to develop a serious illness if you are 50 years of age or older, have a weak immune system, live in a nursing home, or have chronic disease.  There is no medicine to treat COVID-19. Your health care provider will talk with you about ways to treat your symptoms.    Take steps to protect yourself and others from infection. Wash your hands often and disinfect objects and surfaces that are frequently touched every day. Stay away from people who are sick and wear a mask if you are sick. This information is not intended to replace advice given to you by your health care provider. Make sure you discuss any questions you have with your health care provider. Document Revised: 11/30/2018 Document Reviewed: 03/08/2018 Elsevier Patient Education  2020 Elsevier Inc.  

## 2019-05-09 NOTE — Progress Notes (Signed)
  Diagnosis: COVID-19  Physician:drwright  Procedure: [facility] Infusion Clinic Med: remdesivir infusion.  Complications: No immediate complications noted.  Discharge: Discharged home   [place] 05/09/2019

## 2019-05-10 ENCOUNTER — Ambulatory Visit (HOSPITAL_COMMUNITY)
Admit: 2019-05-10 | Discharge: 2019-05-10 | Disposition: A | Discharge status: Home / Self Care | Payer: Self-pay | Attending: Pulmonary Disease | Admitting: Pulmonary Disease

## 2019-05-10 LAB — CULTURE, BLOOD (SINGLE)
Culture: NO GROWTH
Special Requests: ADEQUATE

## 2019-05-10 MED ORDER — DIPHENHYDRAMINE HCL 50 MG/ML IJ SOLN: 50.0000 mg | Freq: Once | INTRAMUSCULAR | Status: DC | PRN

## 2019-05-10 MED ORDER — SODIUM CHLORIDE 0.9 % IV SOLN: INTRAVENOUS | Status: DC | PRN

## 2019-05-10 MED ORDER — METHYLPREDNISOLONE SODIUM SUCC 125 MG IJ SOLR: 125.0000 mg | Freq: Once | INTRAMUSCULAR | Status: DC | PRN

## 2019-05-10 MED ORDER — SODIUM CHLORIDE 0.9 % IV SOLN
100.0000 mg | Freq: Once | INTRAVENOUS | Status: AC
  Administered 2019-05-10: 100 mg via INTRAVENOUS
  Filled 2019-05-10: qty 20

## 2019-05-10 MED ORDER — ALBUTEROL SULFATE HFA 108 (90 BASE) MCG/ACT IN AERS: 2.0000 | INHALATION_SPRAY | Freq: Once | RESPIRATORY_TRACT | Status: DC | PRN

## 2019-05-10 MED ORDER — EPINEPHRINE 0.3 MG/0.3ML IJ SOAJ: 0.3000 mg | Freq: Once | INTRAMUSCULAR | Status: DC | PRN

## 2019-05-10 MED ORDER — FAMOTIDINE IN NACL 20-0.9 MG/50ML-% IV SOLN: 20.0000 mg | Freq: Once | INTRAVENOUS | Status: DC | PRN

## 2019-05-10 NOTE — Progress Notes (Signed)
  Diagnosis: COVID-19  Physician:dr wright  Procedure: Cov[facility] Infusion Clinic Med: remdesivir infusion.  Complications: No immediate complications noted.  Discharge: Discharged home   [place] 05/10/2019

## 2019-05-10 NOTE — Discharge Instructions (Signed)
COVID-19 COVID-19 is a respiratory infection that is caused by a virus called severe acute respiratory syndrome coronavirus 2 (SARS-CoV-2). The disease is also known as coronavirus disease or novel coronavirus. In some people, the virus may not cause any symptoms. In others, it may cause a serious infection. The infection can get worse quickly and can lead to complications, such as:  Pneumonia, or infection of the lungs.  Acute respiratory distress syndrome or ARDS. This is a condition in which fluid build-up in the lungs prevents the lungs from filling with air and passing oxygen into the blood.  Acute respiratory failure. This is a condition in which there is not enough oxygen passing from the lungs to the body or when carbon dioxide is not passing from the lungs out of the body.  Sepsis or septic shock. This is a serious bodily reaction to an infection.  Blood clotting problems.  Secondary infections due to bacteria or fungus.  Organ failure. This is when your body's organs stop working. The virus that causes COVID-19 is contagious. This means that it can spread from person to person through droplets from coughs and sneezes (respiratory secretions). What are the causes? This illness is caused by a virus. You may catch the virus by:  Breathing in droplets from an infected person. Droplets can be spread by a person breathing, speaking, singing, coughing, or sneezing.  Touching something, like a table or a doorknob, that was exposed to the virus (contaminated) and then touching your mouth, nose, or eyes. What increases the risk? Risk for infection You are more likely to be infected with this virus if you:  Are within 6 feet (2 meters) of a person with COVID-19.  Provide care for or live with a person who is infected with COVID-19.  Spend time in crowded indoor spaces or live in shared housing. Risk for serious illness You are more likely to become seriously ill from the virus if you:   Are 50 years of age or older. The higher your age, the more you are at risk for serious illness.  Live in a nursing home or long-term care facility.  Have cancer.  Have a long-term (chronic) disease such as: ? Chronic lung disease, including chronic obstructive pulmonary disease or asthma. ? A long-term disease that lowers your body's ability to fight infection (immunocompromised). ? Heart disease, including heart failure, a condition in which the arteries that lead to the heart become narrow or blocked (coronary artery disease), a disease which makes the heart muscle thick, weak, or stiff (cardiomyopathy). ? Diabetes. ? Chronic kidney disease. ? Sickle cell disease, a condition in which red blood cells have an abnormal "sickle" shape. ? Liver disease.  Are obese. What are the signs or symptoms? Symptoms of this condition can range from mild to severe. Symptoms may appear any time from 2 to 14 days after being exposed to the virus. They include:  A fever or chills.  A cough.  Difficulty breathing.  Headaches, body aches, or muscle aches.  Runny or stuffy (congested) nose.  A sore throat.  New loss of taste or smell. Some people may also have stomach problems, such as nausea, vomiting, or diarrhea. Other people may not have any symptoms of COVID-19. How is this diagnosed? This condition may be diagnosed based on:  Your signs and symptoms, especially if: ? You live in an area with a COVID-19 outbreak. ? You recently traveled to or from an area where the virus is common. ? You   provide care for or live with a person who was diagnosed with COVID-19. ? You were exposed to a person who was diagnosed with COVID-19.  A physical exam.  Lab tests, which may include: ? Taking a sample of fluid from the back of your nose and throat (nasopharyngeal fluid), your nose, or your throat using a swab. ? A sample of mucus from your lungs (sputum). ? Blood tests.  Imaging tests, which  may include, X-rays, CT scan, or ultrasound. How is this treated? At present, there is no medicine to treat COVID-19. Medicines that treat other diseases are being used on a trial basis to see if they are effective against COVID-19. Your health care provider will talk with you about ways to treat your symptoms. For most people, the infection is mild and can be managed at home with rest, fluids, and over-the-counter medicines. Treatment for a serious infection usually takes places in a hospital intensive care unit (ICU). It may include one or more of the following treatments. These treatments are given until your symptoms improve.  Receiving fluids and medicines through an IV.  Supplemental oxygen. Extra oxygen is given through a tube in the nose, a face mask, or a hood.  Positioning you to lie on your stomach (prone position). This makes it easier for oxygen to get into the lungs.  Continuous positive airway pressure (CPAP) or bi-level positive airway pressure (BPAP) machine. This treatment uses mild air pressure to keep the airways open. A tube that is connected to a motor delivers oxygen to the body.  Ventilator. This treatment moves air into and out of the lungs by using a tube that is placed in your windpipe.  Tracheostomy. This is a procedure to create a hole in the neck so that a breathing tube can be inserted.  Extracorporeal membrane oxygenation (ECMO). This procedure gives the lungs a chance to recover by taking over the functions of the heart and lungs. It supplies oxygen to the body and removes carbon dioxide. Follow these instructions at home: Lifestyle  If you are sick, stay home except to get medical care. Your health care provider will tell you how long to stay home. Call your health care provider before you go for medical care.  Rest at home as told by your health care provider.  Do not use any products that contain nicotine or tobacco, such as cigarettes, e-cigarettes, and  chewing tobacco. If you need help quitting, ask your health care provider.  Return to your normal activities as told by your health care provider. Ask your health care provider what activities are safe for you. General instructions  Take over-the-counter and prescription medicines only as told by your health care provider.  Drink enough fluid to keep your urine pale yellow.  Keep all follow-up visits as told by your health care provider. This is important. How is this prevented?  There is no vaccine to help prevent COVID-19 infection. However, there are steps you can take to protect yourself and others from this virus. To protect yourself:   Do not travel to areas where COVID-19 is a risk. The areas where COVID-19 is reported change often. To identify high-risk areas and travel restrictions, check the CDC travel website: wwwnc.cdc.gov/travel/notices  If you live in, or must travel to, an area where COVID-19 is a risk, take precautions to avoid infection. ? Stay away from people who are sick. ? Wash your hands often with soap and water for 20 seconds. If soap and water   are not available, use an alcohol-based hand sanitizer. ? Avoid touching your mouth, face, eyes, or nose. ? Avoid going out in public, follow guidance from your state and local health authorities. ? If you must go out in public, wear a cloth face covering or face mask. Make sure your mask covers your nose and mouth. ? Avoid crowded indoor spaces. Stay at least 6 feet (2 meters) away from others. ? Disinfect objects and surfaces that are frequently touched every day. This may include:  Counters and tables.  Doorknobs and light switches.  Sinks and faucets.  Electronics, such as phones, remote controls, keyboards, computers, and tablets. To protect others: If you have symptoms of COVID-19, take steps to prevent the virus from spreading to others.  If you think you have a COVID-19 infection, contact your health care  provider right away. Tell your health care team that you think you may have a COVID-19 infection.  Stay home. Leave your house only to seek medical care. Do not use public transport.  Do not travel while you are sick.  Wash your hands often with soap and water for 20 seconds. If soap and water are not available, use alcohol-based hand sanitizer.  Stay away from other members of your household. Let healthy household members care for children and pets, if possible. If you have to care for children or pets, wash your hands often and wear a mask. If possible, stay in your own room, separate from others. Use a different bathroom.  Make sure that all people in your household wash their hands well and often.  Cough or sneeze into a tissue or your sleeve or elbow. Do not cough or sneeze into your hand or into the air.  Wear a cloth face covering or face mask. Make sure your mask covers your nose and mouth. Where to find more information  Centers for Disease Control and Prevention: www.cdc.gov/coronavirus/2019-ncov/index.html  World Health Organization: www.who.int/health-topics/coronavirus Contact a health care provider if:  You live in or have traveled to an area where COVID-19 is a risk and you have symptoms of the infection.  You have had contact with someone who has COVID-19 and you have symptoms of the infection. Get help right away if:  You have trouble breathing.  You have pain or pressure in your chest.  You have confusion.  You have bluish lips and fingernails.  You have difficulty waking from sleep.  You have symptoms that get worse. These symptoms may represent a serious problem that is an emergency. Do not wait to see if the symptoms will go away. Get medical help right away. Call your local emergency services (911 in the [place] Do not drive yourself to the hospital. Let the emergency medical personnel know if you think you have COVID-19. Summary  COVID-19 is a  respiratory infection that is caused by a virus. It is also known as coronavirus disease or novel coronavirus. It can cause serious infections, such as pneumonia, acute respiratory distress syndrome, acute respiratory failure, or sepsis.  The virus that causes COVID-19 is contagious. This means that it can spread from person to person through droplets from breathing, speaking, singing, coughing, or sneezing.  You are more likely to develop a serious illness if you are 50 years of age or older, have a weak immune system, live in a nursing home, or have chronic disease.  There is no medicine to treat COVID-19. Your health care provider will talk with you about ways to treat your symptoms.    Take steps to protect yourself and others from infection. Wash your hands often and disinfect objects and surfaces that are frequently touched every day. Stay away from people who are sick and wear a mask if you are sick. This information is not intended to replace advice given to you by your health care provider. Make sure you discuss any questions you have with your health care provider. Document Revised: 11/30/2018 Document Reviewed: 03/08/2018 Elsevier Patient Education  2020 Elsevier Inc.  

## 2019-05-13 ENCOUNTER — Encounter: Payer: Self-pay | Admitting: Family Medicine

## 2019-05-13 ENCOUNTER — Other Ambulatory Visit: Payer: Self-pay

## 2019-05-13 ENCOUNTER — Ambulatory Visit (INDEPENDENT_AMBULATORY_CARE_PROVIDER_SITE_OTHER): Payer: Self-pay | Admitting: Family Medicine

## 2019-05-13 DIAGNOSIS — B342 Coronavirus infection, unspecified: Secondary | ICD-10-CM

## 2019-05-13 DIAGNOSIS — J189 Pneumonia, unspecified organism: Secondary | ICD-10-CM

## 2019-05-13 DIAGNOSIS — Z758 Other problems related to medical facilities and other health care: Secondary | ICD-10-CM

## 2019-05-13 DIAGNOSIS — Z789 Other specified health status: Secondary | ICD-10-CM

## 2019-05-13 DIAGNOSIS — Z09 Encounter for follow-up examination after completed treatment for conditions other than malignant neoplasm: Secondary | ICD-10-CM

## 2019-05-13 DIAGNOSIS — R7303 Prediabetes: Secondary | ICD-10-CM

## 2019-05-13 DIAGNOSIS — Z7689 Persons encountering health services in other specified circumstances: Secondary | ICD-10-CM

## 2019-05-13 DIAGNOSIS — A419 Sepsis, unspecified organism: Secondary | ICD-10-CM

## 2019-05-13 DIAGNOSIS — I10 Essential (primary) hypertension: Secondary | ICD-10-CM

## 2019-05-13 NOTE — Progress Notes (Signed)
Virtual Visit via Telephone Note  I connected with [employer] on 05/13/19 at  1:00 PM EDT by telephone and verified that I am speaking with the correct person using two identifiers.   I discussed the limitations, risks, security and privacy concerns of performing an evaluation and management service by telephone and the availability of in person appointments. I also discussed with the patient that there may be a patient responsible charge related to this service. The patient expressed understanding and agreed to proceed.   History of Present Illness:   History reviewed. No pertinent surgical history.   Social History   Socioeconomic History  . Marital status: Married    Spouse name: Not on file  . Number of children: Not on file  . Years of education: Not on file  . Highest education level: Not on file  Occupational History  . Not on file  Tobacco Use  . Smoking status: Never Smoker  . Smokeless tobacco: Never Used  Substance and Sexual Activity  . Alcohol use: Yes    Comment: 1 beera month  . Drug use: Never  . Sexual activity: Yes  Other Topics Concern  . Not on file  Social History Narrative  . Not on file   Social Determinants of Health   Financial Resource Strain:   . Difficulty of Paying Living Expenses:   Food Insecurity:   . Worried About Charity fundraiser in the Last Year:   . Arboriculturist in the Last Year:   Transportation Needs:   . Film/video editor (Medical):   Marland Kitchen Lack of Transportation (Non-Medical):   Physical Activity:   . Days of Exercise per Week:   . Minutes of Exercise per Session:   Stress:   . Feeling of Stress :   Social Connections:   . Frequency of Communication with Friends and Family:   . Frequency of Social Gatherings with Friends and Family:   . Attends Religious Services:   . Active Member of Clubs or Organizations:   . Attends Archivist Meetings:   Marland Kitchen Marital Status:   Intimate Partner Violence:    . Fear of Current or Ex-Partner:   . Emotionally Abused:   Marland Kitchen Physically Abused:   . Sexually Abused:     Family History  Problem Relation Age of Onset  . Diabetes Mother   . Hypertension Mother     No Known Allergies   Past Medical History:  Diagnosis Date  . CAP (community acquired pneumonia) 04/2019  . Hypocalcemia 04/2019  . Sepsis ([place]) 04/2019    Patient Active Problem List   Diagnosis Date Noted  . COVID-19 05/06/2019  . Acute respiratory failure with hypoxia ([place]) 05/05/2019  . CAP (community acquired pneumonia) 05/05/2019  . Sepsis ([place]) 05/05/2019     Current status: This will be his initial office visit with me. He was previously not seeing a PCP regularly for his PCP needs. Since his last office visit, he was admitted to hospital for Coronavirus, Sepsis and Pneumonia on 05/05/2019. Today, he is doing well with no complaints. We are communicating with translation provided by [employer] today. He denies fatigue, frequent urination, blurred vision, excessive hunger, excessive thirst, weight gain, weight loss, and poor wound healing. He will begin to actively check hi feet regularly. He denies visual changes, chest pain, cough, shortness of breath, heart palpitations, and falls. He has occasional headaches and dizziness with position changes. Denies severe headaches, confusion, seizures, double vision,  and blurred vision, nausea and vomiting.Currently drinks sociably 2-3 drinks per weekend. His anxiety is stable today. He denies suicidal ideations, homicidal ideations, or auditory hallucinations. He denies fevers, chills, recent infections, weight loss, and night sweats. No reports of GI problems such as nausea, vomiting, diarrhea, and constipation. He has no reports of blood in stools, dysuria and hematuria. He is taking all medications as prescribed. He denies pain today.    Observations/Objective:  Telephone Virtual Visit   Assessment and Plan:  1.  Hospital discharge follow-up  2. Encounter to establish care  3. Sepsis without acute organ dysfunction, due to unspecified organism ([place]) Resolved.  4. Coronavirus infection Diagnosed 05/05/2019. Stable today.   5. Community acquired pneumonia, unspecified laterality Resolved.   6. Prediabetes He will continue medication as prescribed, to decrease foods/beverages high in sugars and carbs and follow Heart Healthy or DASH diet. Increase physical activity to at least 30 minutes cardio exercise daily.   7. Essential hypertension He will continue to take medications as prescribed, to decrease high sodium intake, excessive alcohol intake, increase potassium intake, smoking cessation, and increase physical activity of at least 30 minutes of cardio activity daily. He will continue to follow Heart Healthy or DASH diet.  8. Hypocalcemia  9. Language barrier  10. Follow up He will follow up for office visit in 1 month.   No orders of the defined types were placed in this encounter.   No orders of the defined types were placed in this encounter.   Referral Orders  No referral(s) requested today    Kathe Becton,  MSN, FNP-BC [facility] [address] [place], [place] [address] [phone]- fax  Follow Up Instructions:    I discussed the assessment and treatment plan with the patient. The patient was provided an opportunity to ask questions and all were answered. The patient agreed with the plan and demonstrated an understanding of the instructions.   The patient was advised to call back or seek an in-person evaluation if the symptoms worsen or if the condition fails to improve as anticipated.  I provided 20 minutes of non-face-to-face time during this encounter.   Azzie Glatter, FNP

## 2019-05-14 ENCOUNTER — Encounter: Payer: Self-pay | Admitting: Family Medicine

## 2019-05-14 DIAGNOSIS — R7303 Prediabetes: Secondary | ICD-10-CM | POA: Insufficient documentation

## 2019-05-14 DIAGNOSIS — I10 Essential (primary) hypertension: Secondary | ICD-10-CM | POA: Insufficient documentation

## 2019-05-16 DIAGNOSIS — D75839 Thrombocytosis, unspecified: Secondary | ICD-10-CM

## 2019-05-16 DIAGNOSIS — D473 Essential (hemorrhagic) thrombocythemia: Secondary | ICD-10-CM

## 2019-05-16 HISTORY — DX: Thrombocytosis, unspecified: D75.839

## 2019-05-16 HISTORY — DX: Essential (hemorrhagic) thrombocythemia: D47.3

## 2019-06-14 ENCOUNTER — Other Ambulatory Visit: Payer: Self-pay

## 2019-06-14 ENCOUNTER — Encounter: Payer: Self-pay | Admitting: Family Medicine

## 2019-06-14 ENCOUNTER — Ambulatory Visit (INDEPENDENT_AMBULATORY_CARE_PROVIDER_SITE_OTHER): Payer: Self-pay | Admitting: Family Medicine

## 2019-06-14 VITALS — BP 132/62 | HR 79 | Temp 97.9°F | Ht 62.0 in | Wt 161.4 lb

## 2019-06-14 DIAGNOSIS — Z09 Encounter for follow-up examination after completed treatment for conditions other than malignant neoplasm: Secondary | ICD-10-CM

## 2019-06-14 DIAGNOSIS — Z8616 Personal history of COVID-19: Secondary | ICD-10-CM | POA: Insufficient documentation

## 2019-06-14 DIAGNOSIS — Z789 Other specified health status: Secondary | ICD-10-CM

## 2019-06-14 DIAGNOSIS — I10 Essential (primary) hypertension: Secondary | ICD-10-CM

## 2019-06-14 DIAGNOSIS — Z758 Other problems related to medical facilities and other health care: Secondary | ICD-10-CM | POA: Insufficient documentation

## 2019-06-14 LAB — POCT URINALYSIS DIPSTICK
Bilirubin, UA: NEGATIVE
Blood, UA: NEGATIVE
Glucose, UA: NEGATIVE
Ketones, UA: NEGATIVE
Leukocytes, UA: NEGATIVE
Nitrite, UA: NEGATIVE
Protein, UA: NEGATIVE
Spec Grav, UA: >=1.03 — AB (ref 1.010–1.025)
Urobilinogen, UA: 0.2 E.U./dL
pH, UA: 6 (ref 5.0–8.0)

## 2019-06-14 NOTE — Progress Notes (Signed)
Patient [facility]   Established Patient Office Visit  Subjective:  Patient ID: Fernando Lozano, male    DOB: 22-Jun-1970  Age: 50 y.o. MRN: [id number]  CC:  Chief Complaint  Patient presents with  . Follow-up    HPI Fernando Lozano is a 49 year old male who presents for Follow Up today.   Past Medical History:  Diagnosis Date  . CAP (community acquired pneumonia) 04/2019  . Coronavirus infection 04/2019  . Hypertension 04/2019  . Hypocalcemia 04/2019  . Hypocalcemia 04/2019  . Prediabetes 04/2019  . Sepsis ([place]) 04/2019   Current Status: Since his last office visit, he is doing well with no complaints. We will use Hispanic Interpreter today. He denies visual changes, chest pain, cough, shortness of breath, heart palpitations, and falls. She has occasional headaches and dizziness with position changes. Denies severe headaches, confusion, seizures, double vision, and blurred vision, nausea and vomiting. He denies fevers, chills, fatigue, recent infections, weight loss, and night sweats. Denies GI problems such as diarrhea, and constipation. He has no reports of blood in stools, dysuria and hematuria. No depression or anxiety reported today. He denies suicidal ideations, homicidal ideations, or auditory hallucinations. She is taking all medications as prescribed. She denies pain today.   History reviewed. No pertinent surgical history.  Family History  Problem Relation Age of Onset  . Diabetes Mother   . Hypertension Mother     Social History   Socioeconomic History  . Marital status: Married    Spouse name: Not on file  . Number of children: Not on file  . Years of education: Not on file  . Highest education level: Not on file  Occupational History  . Not on file  Tobacco Use  . Smoking status: Never Smoker  . Smokeless tobacco: Never Used  Substance and Sexual Activity  . Alcohol use: Yes    Comment:  1 beera month  . Drug use: Never  . Sexual activity: Yes  Other Topics Concern  . Not on file  Social History Narrative  . Not on file   Social Determinants of Health   Financial Resource Strain:   . Difficulty of Paying Living Expenses:   Food Insecurity:   . Worried About Charity fundraiser in the Last Year:   . Arboriculturist in the Last Year:   Transportation Needs:   . Film/video editor (Medical):   Marland Kitchen Lack of Transportation (Non-Medical):   Physical Activity:   . Days of Exercise per Week:   . Minutes of Exercise per Session:   Stress:   . Feeling of Stress :   Social Connections:   . Frequency of Communication with Friends and Family:   . Frequency of Social Gatherings with Friends and Family:   . Attends Religious Services:   . Active Member of Clubs or Organizations:   . Attends Archivist Meetings:   Marland Kitchen Marital Status:   Intimate Partner Violence:   . Fear of Current or Ex-Partner:   . Emotionally Abused:   Marland Kitchen Physically Abused:   . Sexually Abused:     Outpatient Medications Prior to Visit  Medication Sig Dispense Refill  . albuterol (VENTOLIN HFA) 108 (90 Base) MCG/ACT inhaler Inhale 2 puffs into the lungs every 4 (four) hours as needed for wheezing or shortness of breath. 6.7 g 0  . benzonatate (TESSALON) 200 MG capsule Take 1 capsule (200 mg total) by  mouth 3 (three) times daily as needed for cough. (Patient not taking: Reported on 05/13/2019) 20 capsule 0  . predniSONE (DELTASONE) 10 MG tablet Take 40 mg daily for 1 day, 30 mg daily for 1 day, 20 mg daily for 1 days,10 mg daily for 1 day, then stop 10 tablet 0   No facility-administered medications prior to visit.    No Known Allergies  ROS Review of Systems  Constitutional: Negative.   HENT: Negative.   Eyes: Negative.   Respiratory: Negative.   Cardiovascular: Negative.   Gastrointestinal: Negative.   Endocrine: Negative.   Genitourinary: Negative.   Musculoskeletal: Negative.     Skin: Negative.   Neurological: Positive for dizziness (occasional ) and headaches (occasional ).  Hematological: Negative.   Psychiatric/Behavioral: Negative.    Objective:    Physical Exam  Constitutional: He is oriented to person, place, and time. He appears well-developed and well-nourished.  HENT:  Head: Normocephalic and atraumatic.  Eyes: Conjunctivae are normal.  Cardiovascular: Normal rate, regular rhythm, normal heart sounds and intact distal pulses.  Pulmonary/Chest: Effort normal and breath sounds normal.  Abdominal: Soft. Bowel sounds are normal.  Musculoskeletal:        General: Normal range of motion.     Cervical back: Normal range of motion and neck supple.  Neurological: He is alert and oriented to person, place, and time. He has normal reflexes.  Skin: Skin is warm and dry.  Psychiatric: He has a normal mood and affect. His behavior is normal. Judgment and thought content normal.  Nursing note and vitals reviewed.   BP 132/62   Pulse 79   Temp 97.9 F (36.6 C)   Ht 5\' 2"  (1.575 m)   Wt 161 lb 6.4 oz (73.2 kg)   SpO2 97%   BMI 29.52 kg/m  Wt Readings from Last 3 Encounters:  06/14/19 161 lb 6.4 oz (73.2 kg)  05/05/19 160 lb (72.6 kg)     Health Maintenance Due  Topic Date Due  . COVID-19 Vaccine (1) Never done  . TETANUS/TDAP  Never done  . COLONOSCOPY  Never done    There are no preventive care reminders to display for this patient.  Lab Results  Component Value Date   TSH 0.917 05/05/2019   Lab Results  Component Value Date   WBC 17.3 (H) 05/08/2019   HGB 11.8 (L) 05/08/2019   HCT 34.6 (L) 05/08/2019   MCV 87.2 05/08/2019   PLT 693 (H) 05/08/2019   Lab Results  Component Value Date   NA 140 05/08/2019   K 4.2 05/08/2019   CO2 21 (L) 05/08/2019   GLUCOSE 206 (H) 05/08/2019   BUN 22 (H) 05/08/2019   CREATININE 0.78 05/08/2019   BILITOT 0.5 05/08/2019   ALKPHOS 72 05/08/2019   AST 18 05/08/2019   ALT 26 05/08/2019   PROT 5.9  (L) 05/08/2019   ALBUMIN 2.4 (L) 05/08/2019   CALCIUM 8.6 (L) 05/08/2019   ANIONGAP 11 05/08/2019   No results found for: CHOL No results found for: HDL No results found for: LDLCALC No results found for: TRIG No results found for: CHOLHDL Lab Results  Component Value Date   HGBA1C 6.3 (H) 05/07/2019    Assessment & Plan:   1. Essential hypertension The current medical regimen is effective; blood pressure is stable at 132/62 today; continue present plan and medications as prescribed. She will continue to take medications as prescribed, to decrease high sodium intake, excessive alcohol intake, increase potassium intake, smoking  cessation, and increase physical activity of at least 30 minutes of cardio activity daily. She will continue to follow Heart Healthy or DASH diet. - POCT urinalysis dipstick - CBC with Differential - Comprehensive metabolic panel - Lipid Panel - TSH - Vitamin B12 - Vitamin D, 25-hydroxy  2. History of 2019 novel coronavirus disease (COVID-19)  3. Language barrier  4. Follow up He will follow up in 6 months.    No orders of the defined types were placed in this encounter.   Orders Placed This Encounter  Procedures  . CBC with Differential  . Comprehensive metabolic panel  . Lipid Panel  . TSH  . Vitamin B12  . Vitamin D, 25-hydroxy  . POCT urinalysis dipstick    Referral Orders  No referral(s) requested today    Kathe Becton,  MSN, FNP-BC [facility] [address] [place], [place] [address] [phone] fax    Problem List Items Addressed This Visit      Cardiovascular and Mediastinum   Essential hypertension - Primary   Relevant Orders   POCT urinalysis dipstick (Completed)   CBC with Differential   Comprehensive metabolic panel   Lipid Panel   TSH   Vitamin B12   Vitamin D, 25-hydroxy    Other Visit Diagnoses    History of 2019 novel  coronavirus disease (COVID-19)       Language barrier       Follow up          No orders of the defined types were placed in this encounter.   Follow-up: No follow-ups on file.    Azzie Glatter, FNP

## 2019-06-15 LAB — COMPREHENSIVE METABOLIC PANEL
ALT: 25 IU/L (ref 0–44)
AST: 19 IU/L (ref 0–40)
Albumin/Globulin Ratio: 2.1 (ref 1.2–2.2)
Albumin: 4.6 g/dL (ref 4.0–5.0)
Alkaline Phosphatase: 70 IU/L (ref 39–117)
BUN/Creatinine Ratio: 17 (ref 9–20)
BUN: 14 mg/dL (ref 6–24)
Bilirubin Total: 0.4 mg/dL (ref 0.0–1.2)
CO2: 23 mmol/L (ref 20–29)
Calcium: 9.4 mg/dL (ref 8.7–10.2)
Chloride: 105 mmol/L (ref 96–106)
Creatinine, Ser: 0.82 mg/dL (ref 0.76–1.27)
GFR calc Af Amer: 119 mL/min/{1.73_m2} (ref >59)
GFR calc non Af Amer: 103 mL/min/{1.73_m2} (ref >59)
Globulin, Total: 2.2 g/dL (ref 1.5–4.5)
Glucose: 85 mg/dL (ref 65–99)
Potassium: 4.1 mmol/L (ref 3.5–5.2)
Sodium: 142 mmol/L (ref 134–144)
Total Protein: 6.8 g/dL (ref 6.0–8.5)

## 2019-06-15 LAB — CBC WITH DIFFERENTIAL/PLATELET
Basophils Absolute: 0.1 10*3/uL (ref 0.0–0.2)
Basos: 1 %
EOS (ABSOLUTE): 0.5 10*3/uL — ABNORMAL HIGH (ref 0.0–0.4)
Eos: 7 %
Hematocrit: 41.5 % (ref 37.5–51.0)
Hemoglobin: 13.8 g/dL (ref 13.0–17.7)
Immature Grans (Abs): 0 10*3/uL (ref 0.0–0.1)
Immature Granulocytes: 0 %
Lymphocytes Absolute: 1.7 10*3/uL (ref 0.7–3.1)
Lymphs: 21 %
MCH: 29.7 pg (ref 26.6–33.0)
MCHC: 33.3 g/dL (ref 31.5–35.7)
MCV: 89 fL (ref 79–97)
Monocytes Absolute: 0.4 10*3/uL (ref 0.1–0.9)
Monocytes: 5 %
Neutrophils Absolute: 5.4 10*3/uL (ref 1.4–7.0)
Neutrophils: 66 %
Platelets: 365 10*3/uL (ref 150–450)
RBC: 4.65 x10E6/uL (ref 4.14–5.80)
RDW: 15.2 % (ref 11.6–15.4)
WBC: 8.1 10*3/uL (ref 3.4–10.8)

## 2019-06-15 LAB — LIPID PANEL
Chol/HDL Ratio: 3.2 ratio (ref 0.0–5.0)
Cholesterol, Total: 181 mg/dL (ref 100–199)
HDL: 57 mg/dL (ref >39)
LDL Chol Calc (NIH): 107 mg/dL — ABNORMAL HIGH (ref 0–99)
Triglycerides: 94 mg/dL (ref 0–149)
VLDL Cholesterol Cal: 17 mg/dL (ref 5–40)

## 2019-06-15 LAB — VITAMIN D 25 HYDROXY (VIT D DEFICIENCY, FRACTURES): Vit D, 25-Hydroxy: 31.9 ng/mL (ref 30.0–100.0)

## 2019-06-15 LAB — VITAMIN B12: Vitamin B-12: 386 pg/mL (ref 232–1245)

## 2019-06-15 LAB — TSH: TSH: 1.6 u[IU]/mL (ref 0.450–4.500)

## 2019-06-17 ENCOUNTER — Encounter: Payer: Self-pay | Admitting: Family Medicine

## 2019-12-13 ENCOUNTER — Ambulatory Visit: Payer: Self-pay | Admitting: Family Medicine

## 2019-12-17 ENCOUNTER — Ambulatory Visit: Payer: Self-pay | Admitting: Family Medicine

## 2020-09-14 IMAGING — DX DG CHEST 1V PORT
1 series · 1 of 1 positions shown · non-contrast
Comparison: October 31, 2016

CLINICAL DATA: Fever, cough and congestion.

EXAM:
PORTABLE CHEST 1 VIEW

[chest]
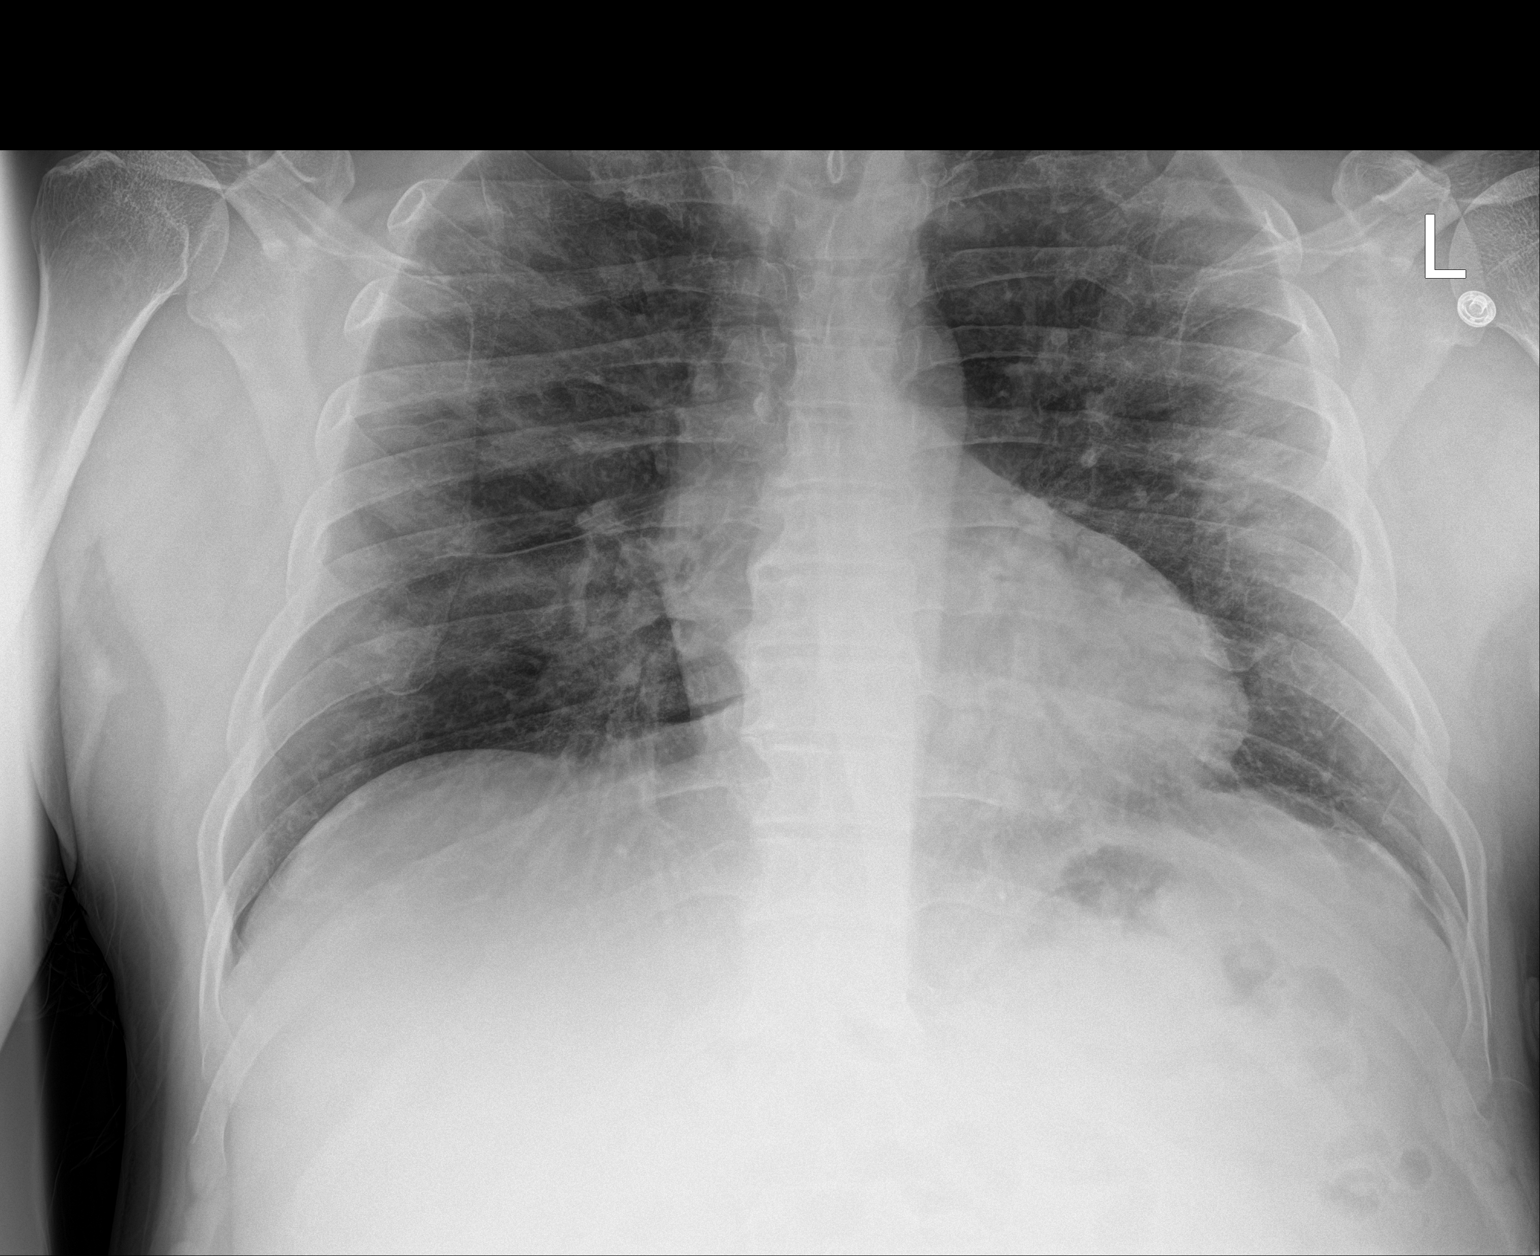

[1 of 1 positions shown; findings below may reference images not displayed]

FINDINGS: Mild diffuse bilateral infiltrates are seen involving predominantly
the lateral aspects of both lungs. There is no evidence of a pleural
effusion or pneumothorax. The heart size and mediastinal contours
are within normal limits. The visualized skeletal structures are
unremarkable.
IMPRESSION: Mild diffuse bilateral infiltrates.
